# Patient Record
Sex: Female | Born: 1995
Health system: Southern US, Community
[De-identification: ages and names within clinical notes are randomized; demographics above are authoritative.]

## PROBLEM LIST (undated history)

## (undated) ENCOUNTER — Inpatient Hospital Stay (HOSPITAL_COMMUNITY): Payer: Private Health Insurance - Indemnity

## (undated) DIAGNOSIS — F988 Other specified behavioral and emotional disorders with onset usually occurring in childhood and adolescence: Secondary | ICD-10-CM

## (undated) DIAGNOSIS — I10 Essential (primary) hypertension: Secondary | ICD-10-CM

## (undated) DIAGNOSIS — E669 Obesity, unspecified: Secondary | ICD-10-CM

## (undated) DIAGNOSIS — F419 Anxiety disorder, unspecified: Secondary | ICD-10-CM

## (undated) HISTORY — PX: BARIATRIC SURGERY: SHX1103

## (undated) HISTORY — DX: Obesity, unspecified: E66.9

## (undated) HISTORY — PX: CHOLECYSTECTOMY: SHX55

---
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
## 2017-06-20 ENCOUNTER — Encounter (HOSPITAL_BASED_OUTPATIENT_CLINIC_OR_DEPARTMENT_OTHER): Payer: Self-pay

## 2017-06-20 ENCOUNTER — Other Ambulatory Visit: Payer: Self-pay

## 2017-06-20 ENCOUNTER — Emergency Department (HOSPITAL_BASED_OUTPATIENT_CLINIC_OR_DEPARTMENT_OTHER): Payer: Medicaid Other

## 2017-06-20 ENCOUNTER — Emergency Department (HOSPITAL_BASED_OUTPATIENT_CLINIC_OR_DEPARTMENT_OTHER)
Admission: EM | Admit: 2017-06-20 | Discharge: 2017-06-20 | Disposition: A | Discharge status: Home / Self Care | Payer: Medicaid Other | Attending: Emergency Medicine | Admitting: Emergency Medicine

## 2017-06-20 DIAGNOSIS — R072 Precordial pain: Secondary | ICD-10-CM | POA: Diagnosis not present

## 2017-06-20 DIAGNOSIS — R0602 Shortness of breath: Secondary | ICD-10-CM | POA: Diagnosis not present

## 2017-06-20 DIAGNOSIS — N3000 Acute cystitis without hematuria: Secondary | ICD-10-CM | POA: Diagnosis not present

## 2017-06-20 DIAGNOSIS — R079 Chest pain, unspecified: Secondary | ICD-10-CM | POA: Diagnosis present

## 2017-06-20 DIAGNOSIS — Z79899 Other long term (current) drug therapy: Secondary | ICD-10-CM | POA: Insufficient documentation

## 2017-06-20 DIAGNOSIS — R002 Palpitations: Secondary | ICD-10-CM | POA: Insufficient documentation

## 2017-06-20 DIAGNOSIS — I10 Essential (primary) hypertension: Secondary | ICD-10-CM | POA: Diagnosis not present

## 2017-06-20 HISTORY — DX: Other specified behavioral and emotional disorders with onset usually occurring in childhood and adolescence: F98.8

## 2017-06-20 HISTORY — DX: Anxiety disorder, unspecified: F41.9

## 2017-06-20 HISTORY — DX: Essential (primary) hypertension: I10

## 2017-06-20 LAB — COMPREHENSIVE METABOLIC PANEL
ALK PHOS: 96 U/L (ref 38–126)
ALT: 23 U/L (ref 14–54)
ANION GAP: 5 (ref 5–15)
AST: 19 U/L (ref 15–41)
Albumin: 4.1 g/dL (ref 3.5–5.0)
BILIRUBIN TOTAL: 0.2 mg/dL — AB (ref 0.3–1.2)
BUN: 10 mg/dL (ref 6–20)
CALCIUM: 8.9 mg/dL (ref 8.9–10.3)
CO2: 26 mmol/L (ref 22–32)
Chloride: 105 mmol/L (ref 101–111)
Creatinine, Ser: 0.61 mg/dL (ref 0.44–1.00)
GLUCOSE: 88 mg/dL (ref 65–99)
Potassium: 3.6 mmol/L (ref 3.5–5.1)
Sodium: 136 mmol/L (ref 135–145)
Total Protein: 8.1 g/dL (ref 6.5–8.1)

## 2017-06-20 LAB — TROPONIN I: Troponin I: <0.03 ng/mL (ref <0.03)

## 2017-06-20 LAB — CBC WITH DIFFERENTIAL/PLATELET
Basophils Absolute: 0 10*3/uL (ref 0.0–0.1)
Basophils Relative: 0 %
Eosinophils Absolute: 0.2 10*3/uL (ref 0.0–0.7)
Eosinophils Relative: 2 %
HCT: 42.1 % (ref 36.0–46.0)
HEMOGLOBIN: 13.7 g/dL (ref 12.0–15.0)
LYMPHS ABS: 1.6 10*3/uL (ref 0.7–4.0)
LYMPHS PCT: 14 %
MCH: 27.1 pg (ref 26.0–34.0)
MCHC: 32.5 g/dL (ref 30.0–36.0)
MCV: 83.4 fL (ref 78.0–100.0)
MONOS PCT: 6 %
Monocytes Absolute: 0.7 10*3/uL (ref 0.1–1.0)
NEUTROS PCT: 78 %
Neutro Abs: 9.2 10*3/uL — ABNORMAL HIGH (ref 1.7–7.7)
Platelets: 233 10*3/uL (ref 150–400)
RBC: 5.05 MIL/uL (ref 3.87–5.11)
RDW: 15.6 % — ABNORMAL HIGH (ref 11.5–15.5)
WBC: 11.7 10*3/uL — AB (ref 4.0–10.5)

## 2017-06-20 LAB — URINALYSIS, ROUTINE W REFLEX MICROSCOPIC
Bilirubin Urine: NEGATIVE
Glucose, UA: NEGATIVE mg/dL
HGB URINE DIPSTICK: NEGATIVE
Ketones, ur: NEGATIVE mg/dL
Nitrite: POSITIVE — AB
Protein, ur: NEGATIVE mg/dL
pH: 6.5 (ref 5.0–8.0)

## 2017-06-20 LAB — URINALYSIS, MICROSCOPIC (REFLEX)

## 2017-06-20 LAB — D-DIMER, QUANTITATIVE (NOT AT ARMC): D DIMER QUANT: 0.39 ug{FEU}/mL (ref 0.00–0.50)

## 2017-06-20 LAB — MAGNESIUM: MAGNESIUM: 1.9 mg/dL (ref 1.7–2.4)

## 2017-06-20 LAB — PREGNANCY, URINE: Preg Test, Ur: NEGATIVE

## 2017-06-20 LAB — LIPASE, BLOOD: Lipase: 22 U/L (ref 11–51)

## 2017-06-20 MED ORDER — TRAMADOL HCL 50 MG PO TABS
50.0000 mg | ORAL_TABLET | Freq: Once | ORAL | Status: AC
  Administered 2017-06-20: 50 mg via ORAL
  Filled 2017-06-20: qty 1

## 2017-06-20 MED ORDER — TRAMADOL HCL 50 MG PO TABS: 50.0000 mg | ORAL_TABLET | Freq: Four times a day (QID) | ORAL | 0 refills | Status: AC | PRN

## 2017-06-20 MED ORDER — CEPHALEXIN 500 MG PO CAPS: 500.0000 mg | ORAL_CAPSULE | Freq: Two times a day (BID) | ORAL | 0 refills | Status: AC

## 2017-06-20 MED ORDER — ASPIRIN 81 MG PO CHEW
324.0000 mg | CHEWABLE_TABLET | Freq: Once | ORAL | Status: AC
  Administered 2017-06-20: 324 mg via ORAL
  Filled 2017-06-20: qty 4

## 2017-06-20 MED ORDER — CYCLOBENZAPRINE HCL 10 MG PO TABS: 10.0000 mg | ORAL_TABLET | Freq: Two times a day (BID) | ORAL | 0 refills | Status: AC | PRN

## 2017-06-20 NOTE — ED Notes (Signed)
ED Provider at bedside. 

## 2017-06-20 NOTE — ED Notes (Signed)
Patient transported to X-ray 

## 2017-06-20 NOTE — ED Provider Notes (Signed)
[facility] EMERGENCY DEPARTMENT Provider Note   CSN: [id number] Arrival date & time: 06/20/17  1129     History   Chief Complaint Chief Complaint  Patient presents with  . Chest Pain    HPI Emma Cherry is a 21 y.o. female.  The history is provided by the patient, a parent and medical records. No language interpreter was used.  Chest Pain   This is a new problem. The current episode started 12 to 24 hours ago. The problem occurs constantly. The problem has not changed since onset.The pain is present in the substernal region. The pain is moderate. The quality of the pain is described as pleuritic and sharp. The pain does not radiate. The symptoms are aggravated by certain positions and deep breathing. Associated symptoms include nausea, palpitations and shortness of breath. Pertinent negatives include no abdominal pain, no back pain, no cough, no diaphoresis, no dizziness, no fever, no headaches, no hemoptysis, no leg pain, no lower extremity edema, no near-syncope, no numbness and no sputum production. She has tried nothing for the symptoms. The treatment provided no relief.    Past Medical History:  Diagnosis Date  . ADD (attention deficit disorder)   . Anxiety   . Hypertension   . Obesity     Patient Active Problem List   Diagnosis Date Noted  . Depressive disorder, not elsewhere classified 02/27/2013  . Attention deficit disorder without mention of hyperactivity 02/22/2013    Past Surgical History:  Procedure Laterality Date  . CHOLECYSTECTOMY    . VAGINAL DELIVERY     X 2  . WISDOM TOOTH EXTRACTION  May 2014    OB History    No data available       Home Medications    Prior to Admission medications   Medication Sig Start Date End Date Taking? Authorizing Provider  ALPRAZolam Prudy Feeler(XANAX) 0.5 MG tablet Take 0.5 mg at bedtime as needed by mouth for anxiety.   Yes [provider]  lisdexamfetamine (VYVANSE) 70 MG capsule Take 70 mg daily by  mouth.   Yes [provider]  methylphenidate (CONCERTA) 36 MG CR tablet Take 1 tablet (36 mg total) by mouth 2 (two) times daily. 03/01/13  Yes Yolande JollyWatt, Alan H, PA-C  sertraline (ZOLOFT) 25 MG tablet Take 25 mg daily by mouth.   Yes [provider]    Family History Family History  Problem Relation Age of Onset  . Bipolar disorder Father   . Bipolar disorder Brother   . Alcohol abuse Maternal Grandfather   . Drug abuse Cousin     Social History Social History   Tobacco Use  . Smoking status: Never Smoker  . Smokeless tobacco: Never Used  Substance Use Topics  . Alcohol use: No  . Drug use: No     Allergies   Codeine   Review of Systems Review of Systems  Constitutional: Negative for chills, diaphoresis, fatigue and fever.  HENT: Negative for congestion.   Eyes: Negative for visual disturbance.  Respiratory: Positive for shortness of breath. Negative for cough, hemoptysis, sputum production, chest tightness, wheezing and stridor.   Cardiovascular: Positive for chest pain and palpitations. Negative for leg swelling and near-syncope.  Gastrointestinal: Positive for nausea. Negative for abdominal pain and diarrhea.  Genitourinary: Negative for dysuria, enuresis and pelvic pain.  Musculoskeletal: Negative for back pain, neck pain and neck stiffness.  Skin: Negative for wound.  Neurological: Negative for dizziness, numbness and headaches.  All other systems reviewed and are  negative.    Physical Exam Updated Vital Signs BP 113/72 (BP Location: Right Arm)   Pulse (!) 105   Temp 98.2 F (36.8 C) (Oral)   Resp 12   Ht 5\' 8"  (1.727 m)   Wt 129.3 kg (285 lb)   SpO2 100%   BMI 43.33 kg/m   Physical Exam  Constitutional: She is oriented to person, place, and time. She appears well-developed and well-nourished.  Non-toxic appearance. She does not appear ill. No distress.  HENT:  Head: Normocephalic and atraumatic.  Right Ear: External ear normal.  Left  Ear: External ear normal.  Nose: Nose normal.  Mouth/Throat: Oropharynx is clear and moist. No oropharyngeal exudate.  Eyes: Conjunctivae and EOM are normal. Pupils are equal, round, and reactive to light.  Neck: Normal range of motion. Neck supple.  Cardiovascular: Regular rhythm. Tachycardia present.    No systolic murmur is present. Pulmonary/Chest: No stridor. No respiratory distress. She has no wheezes. She has no rhonchi. She has no rales. She exhibits bony tenderness.    Abdominal: She exhibits no distension. There is no tenderness. There is no rebound.  Musculoskeletal:       Right lower leg: She exhibits no tenderness.       Left lower leg: She exhibits no tenderness.  Lymphadenopathy:    She has no cervical adenopathy.  Neurological: She is alert and oriented to person, place, and time. She has normal reflexes. She exhibits normal muscle tone. Coordination normal.  Skin: Skin is warm. No rash noted. She is not diaphoretic. No erythema.  Nursing note and vitals reviewed.    ED Treatments / Results  Labs (all labs ordered are listed, but only abnormal results are displayed) Labs Reviewed  CBC WITH DIFFERENTIAL/PLATELET - Abnormal; Notable for the following components:      Result Value   WBC 11.7 (*)    RDW 15.6 (*)    Neutro Abs 9.2 (*)    All other components within normal limits  COMPREHENSIVE METABOLIC PANEL - Abnormal; Notable for the following components:   Total Bilirubin 0.2 (*)    All other components within normal limits  URINALYSIS, ROUTINE W REFLEX MICROSCOPIC - Abnormal; Notable for the following components:   APPearance CLOUDY (*)    Specific Gravity, Urine >1.030 (*)    Nitrite POSITIVE (*)    Leukocytes, UA SMALL (*)    All other components within normal limits  URINALYSIS, MICROSCOPIC (REFLEX) - Abnormal; Notable for the following components:   Bacteria, UA MANY (*)    Squamous Epithelial / LPF 6-30 (*)    All other components within normal limits   LIPASE, BLOOD  PREGNANCY, URINE  TROPONIN I  TROPONIN I  D-DIMER, QUANTITATIVE (NOT AT [facility])  MAGNESIUM    EKG  EKG Interpretation  Date/Time:  Monday June 20 2017 11:36:48 EST Ventricular Rate:  109 PR Interval:    QRS Duration: 83 QT Interval:  336 QTC Calculation: 453 R Axis:   63 Text Interpretation:  Sinus tachycardia Borderline Q waves in inferior leads Baseline wander in lead(s) II No prior ECG for comparison.  S1Q3T3 seen.  No STEMI Confirmed by Theda Belfastegeler, Chris ([id number]) on 06/20/2017 11:44:42 AM       Radiology Dg Chest 2 View  Result Date: 06/20/2017 CLINICAL DATA:  Chest pain and nausea. EXAM: CHEST  2 VIEW COMPARISON:  None. FINDINGS: Trachea is midline. Heart size normal. Lungs are low in volume but clear. No pleural fluid. IMPRESSION: Low lung volumes.  No  acute findings. Electronically Signed   By: Leanna Battles M.D.   On: 06/20/2017 12:27    Procedures Procedures (including critical care time)  Medications Ordered in ED Medications  aspirin chewable tablet 324 mg (324 mg Oral Given 06/20/17 1222)  traMADol (ULTRAM) tablet 50 mg (50 mg Oral Given 06/20/17 1222)     Initial Impression / Assessment and Plan / ED Course  I have reviewed the triage vital signs and the nursing notes.  Pertinent labs & imaging results that were available during my care of the patient were reviewed by me and considered in my medical decision making (see chart for details).      Myka Hitz is a 21 y.o. female with a past medical history significant for anxiety and hypertension who presents with left-sided chest pain.  Patient reports that she was feeling normal yesterday but woke up today with discomfort in her chest.  She describes as a tightness and sharpness.  It is in the left chest radiating up to the left upper chest.  She reports associated shortness of breath and nausea but no vomiting.  She reports some mild diaphoresis but no syncopal episodes.  She does report  feeling irregular heartbeats at times.  Patient is anxious and very concerned because she has a strong family history of cardiac disease.  She denies any leg pain or leg swelling.  No history of DVT or PE.  Patient has a family history of heart failure and atrial fibrillation.  Patient denies any recent trauma or any changes in medications.  She denies any recent fevers, chills, or cough.  She denies any hemoptysis.  She does report the pain is pleuritic in nature it is not exertional.  She describes her pain as moderate to severe as a 9 out of 10 in severity.  On exam, chest is tender to palpation.  Discomfort is reproduced with palpation.  Lungs are clear.  No murmur appreciated.  Abdomen nontender.  Legs were nonedematous and nontender.  No CVA tenderness.  No focal neurologic deficits.  Patient had pulses in all extremities.  EKG showed possible S1/Q3T3.  Given the pleuritic discomfort, d-dimer was ordered.  No STEMI was seen.  Patient was given aspirin and troponins were.  Patient will have laboratory testing and x-ray to further evaluate.  I suspect a muscular skeletal pain, patient will be given tramadol as she reports other pain medications of caused hyperactivity.  Anticipate reassessment after workup.  Patient's heart score is a 3.  Laboratory testing revealed negative troponin x2.  Negative pregnancy test.  Negative d-dimer.  The patient was found to have evidence of urinary tract infection with nitrites leukocytes and bacteria.  Patient will be treated for UTI.  Chest x-ray reassuring.  EKG showed no STEMI.  Given the patient's chest tenderness, suspect a musculoskeletal discomfort.  Patient given prescription for pain medicine, muscle relaxant, and antibiotics.  Patient reported feeling better after her pain medication in the ED.    Patient will follow up with her PCP and understood return precautions.  Patient had no other questions or concerns and was discharged in good condition with  improving symptoms.    Final Clinical Impressions(s) / ED Diagnoses   Final diagnoses:  Precordial pain  Acute cystitis without hematuria    ED Discharge Orders        Ordered    traMADol (ULTRAM) 50 MG tablet  Every 6 hours PRN     06/20/17 1705    cyclobenzaprine (FLEXERIL) 10  MG tablet  2 times daily PRN     06/20/17 1705    cephALEXin (KEFLEX) 500 MG capsule  2 times daily     06/20/17 1705      Clinical Impression: 1. Precordial pain   2. Acute cystitis without hematuria     Disposition: Discharge  Condition: Good  I have discussed the results, Dx and Tx plan with the pt(& family if present). He/she/they expressed understanding and agree(s) with the plan. Discharge instructions discussed at great length. Strict return precautions discussed and pt &/or family have verbalized understanding of the instructions. No further questions at time of discharge.    This SmartLink is deprecated. Use AVSMEDLIST instead to display the medication list for a patient.  Follow Up: Leola Brazil, DO [address] [place] [address] [phone]  Schedule an appointment as soon as possible for a visit    [facility] [address] [id number] mc [address] [place] [address] [phone]  If symptoms worsen     Anelise Staron, Canary Brim, MD 06/20/17 2009

## 2017-06-20 NOTE — Discharge Instructions (Signed)
Your workup today showed no evidence of a heart or lung cause of your discomfort.  As you were very tender on the outside, we suspect it is a musculoskeletal type chest pain.  We also found evidence of urinary tract infection today.  Please take the antibiotics to treat the UTI and take the pain and muscle relaxant to help the chest discomfort.  Please stay hydrated and rest.  Please follow-up with your primary care physician.  If any symptoms change or worsen, please return to the nearest emergency department.

## 2017-06-20 NOTE — ED Triage Notes (Addendum)
Pt reports about 3 hours ago while at work she started having upper mid chest pain with some nausea. Pt has family history of heart issues. Pt has history of anxiety. Pt reports she has never had this kind of pain before.

## 2018-11-29 ENCOUNTER — Emergency Department (HOSPITAL_BASED_OUTPATIENT_CLINIC_OR_DEPARTMENT_OTHER): Payer: Medicaid Other

## 2018-11-29 ENCOUNTER — Telehealth: Payer: Medicaid Other | Admitting: Family

## 2018-11-29 ENCOUNTER — Other Ambulatory Visit: Payer: Self-pay

## 2018-11-29 ENCOUNTER — Emergency Department (HOSPITAL_BASED_OUTPATIENT_CLINIC_OR_DEPARTMENT_OTHER)
Admission: EM | Admit: 2018-11-29 | Discharge: 2018-11-29 | Disposition: A | Discharge status: Home / Self Care | Payer: Medicaid Other | Attending: Emergency Medicine | Admitting: Emergency Medicine

## 2018-11-29 ENCOUNTER — Encounter (HOSPITAL_BASED_OUTPATIENT_CLINIC_OR_DEPARTMENT_OTHER): Payer: Self-pay | Admitting: Emergency Medicine

## 2018-11-29 DIAGNOSIS — R0602 Shortness of breath: Secondary | ICD-10-CM | POA: Insufficient documentation

## 2018-11-29 DIAGNOSIS — R509 Fever, unspecified: Secondary | ICD-10-CM | POA: Diagnosis not present

## 2018-11-29 DIAGNOSIS — I10 Essential (primary) hypertension: Secondary | ICD-10-CM | POA: Diagnosis not present

## 2018-11-29 DIAGNOSIS — Z79899 Other long term (current) drug therapy: Secondary | ICD-10-CM | POA: Insufficient documentation

## 2018-11-29 DIAGNOSIS — R0600 Dyspnea, unspecified: Secondary | ICD-10-CM

## 2018-11-29 LAB — URINALYSIS, MICROSCOPIC (REFLEX)

## 2018-11-29 LAB — URINALYSIS, ROUTINE W REFLEX MICROSCOPIC
Bilirubin Urine: NEGATIVE
Glucose, UA: NEGATIVE mg/dL
Ketones, ur: NEGATIVE mg/dL
Nitrite: NEGATIVE
Protein, ur: NEGATIVE mg/dL
Specific Gravity, Urine: >1.03 — ABNORMAL HIGH (ref 1.005–1.030)
pH: 6 (ref 5.0–8.0)

## 2018-11-29 MED ORDER — ONDANSETRON 4 MG PO TBDP: 4.0000 mg | ORAL_TABLET | Freq: Three times a day (TID) | ORAL | 0 refills | Status: AC | PRN

## 2018-11-29 MED ORDER — SODIUM CHLORIDE 0.9 % IV BOLUS
1000.0000 mL | Freq: Once | INTRAVENOUS | Status: AC
Start: 2018-11-29 — End: 2018-11-29
  Administered 2018-11-29: 16:00:00 1000 mL via INTRAVENOUS

## 2018-11-29 MED ORDER — KETOROLAC TROMETHAMINE 30 MG/ML IJ SOLN
30.0000 mg | Freq: Once | INTRAMUSCULAR | Status: AC
  Administered 2018-11-29: 30 mg via INTRAVENOUS
  Filled 2018-11-29: qty 1

## 2018-11-29 MED ORDER — ONDANSETRON HCL 4 MG/2ML IJ SOLN
4.0000 mg | Freq: Once | INTRAMUSCULAR | Status: AC
  Administered 2018-11-29: 4 mg via INTRAVENOUS
  Filled 2018-11-29: qty 2

## 2018-11-29 MED ORDER — IBUPROFEN 600 MG PO TABS: 600.0000 mg | ORAL_TABLET | Freq: Three times a day (TID) | ORAL | 0 refills | Status: AC | PRN

## 2018-11-29 MED FILL — ONDANSETRON ODT 4 MG TABLET: 4 | 6 days supply | Qty: 20 | Fill #0

## 2018-11-29 MED FILL — IBUPROFEN 600 MG TABLET: 600 | 7 days supply | Qty: 21 | Fill #0

## 2018-11-29 NOTE — Progress Notes (Signed)
  E-Visit for [employer] Virus Screening  Based on what you have shared with me, you need to seek an evaluation for a severe illness that is causing your symptoms which may be coronavirus or some other illness. I recommend that you be seen and evaluated "face to face". Our Emergency Departments are best equipped to handle patients with severe symptoms.   I recommend the following:  . If you are having a true medical emergency please call 911. . If you are considered high risk for Corona virus because of a known exposure, fever, shortness of breath and cough, OR if you have severe symptoms of any kind, seek medical care at an emergency room.  . Please call ahead and tell them that you were seen by telemedicine and they have recommended that you have a face to face evaluation. [facility] Emergency Department [address] [place], [place], [place] [address] [phone]  . [facility] Emergency Department [address] [place], [place] [address] [phone]  . [facility] Emergency Department [address] [place], [place], [place] [address] [phone]  . [facility] Emergency Department [address] [place], [place], [place] [address] [phone]  . [facility] Emergency Department [address] [place], [place], [place] [address] [phone]  NOTE: If you entered your credit card information for this eVisit, you will not be charged. You may see a "hold" on your card for the $35 but that hold will drop off and you will not have a charge processed.   Your e-visit answers were reviewed by a board certified advanced clinical practitioner to complete your personal care plan.  Thank you for using e-Visits. Greater than 5 minutes, yet less than 10 minutes of time have been spent researching, coordinating, and implementing care for this patient today.  Thank you for the details you included in the  comment boxes. Those details are very helpful in determining the best course of treatment for you and help [place] to provide the best care.

## 2018-11-29 NOTE — ED Provider Notes (Signed)
[facility] EMERGENCY DEPARTMENT Provider Note   CSN: [id number] Arrival date & time: 11/29/18  1400    History   Chief Complaint Chief Complaint  Patient presents with   Shortness of Breath    HPI Emma Cherry is a 23 y.o. female.     The history is provided by the patient and medical records. No language interpreter was used.  Shortness of Breath  Associated symptoms: fever   Associated symptoms: no abdominal pain, no cough, no sore throat and no vomiting    Emma Cherry is a 23 y.o. female  with a PMH as listed below who presents to the Emergency Department complaining of shortness of breath and fever of 101 which began yesterday.  Did not take her temperature this morning, but did feel feverish again.  She informed nursing staff that she took an ibuprofen this morning.  She told me that she has not taken any medications other than her usual home medications today.  She is afebrile in triage.  She states that she works at [employer] and 1 of her coworkers was diagnosed with coronavirus.  She was in contact with him at work 2 or 3 days before she found out about his diagnosis.  She does note associated decreased appetite, myalgias mostly low back pain and fatigue.  She denies any abdominal pain, vomiting, diarrhea, constipation or urinary symptoms.    Past Medical History:  Diagnosis Date   ADD (attention deficit disorder)    Anxiety    Hypertension    Obesity     Patient Active Problem List   Diagnosis Date Noted   Depressive disorder, not elsewhere classified 02/27/2013   Attention deficit disorder without mention of hyperactivity 02/22/2013    Past Surgical History:  Procedure Laterality Date   CHOLECYSTECTOMY     VAGINAL DELIVERY     X 2   WISDOM TOOTH EXTRACTION  May 2014     OB History   No obstetric history on file.      Home Medications    Prior to Admission medications   Medication Sig Start Date End  Date Taking? Authorizing Provider  ALPRAZolam Prudy Feeler) 0.5 MG tablet Take 0.5 mg at bedtime as needed by mouth for anxiety.   Yes [provider]  lisdexamfetamine (VYVANSE) 70 MG capsule Take 70 mg daily by mouth.   Yes [provider]  methylphenidate (CONCERTA) 36 MG CR tablet Take 1 tablet (36 mg total) by mouth 2 (two) times daily. 03/01/13  Yes Yolande Jolly, PA-C  sertraline (ZOLOFT) 25 MG tablet Take 25 mg daily by mouth.   Yes [provider]  cyclobenzaprine (FLEXERIL) 10 MG tablet Take 1 tablet (10 mg total) 2 (two) times daily as needed by mouth for muscle spasms. 06/20/17   Tegeler, Canary Brim, MD  ibuprofen (ADVIL) 600 MG tablet Take 1 tablet (600 mg total) by mouth every 8 (eight) hours as needed for fever, mild pain or moderate pain. 11/29/18   Emma Cherry, Chase Picket, PA-C  ondansetron (ZOFRAN ODT) 4 MG disintegrating tablet Take 1 tablet (4 mg total) by mouth every 8 (eight) hours as needed for nausea or vomiting. 11/29/18   Kaylena Pacifico, Chase Picket, PA-C  traMADol (ULTRAM) 50 MG tablet Take 1 tablet (50 mg total) every 6 (six) hours as needed by mouth. 06/20/17   Tegeler, Canary Brim, MD    Family History Family History  Problem Relation Age of Onset   Bipolar disorder Father  Bipolar disorder Brother    Alcohol abuse Maternal Grandfather    Drug abuse Cousin     Social History Social History   Tobacco Use   Smoking status: Never Smoker   Smokeless tobacco: Never Used  Substance Use Topics   Alcohol use: No   Drug use: No     Allergies   Codeine   Review of Systems Review of Systems  Constitutional: Positive for appetite change and fever.  HENT: Negative for sore throat.   Respiratory: Positive for shortness of breath. Negative for cough.   Gastrointestinal: Positive for nausea. Negative for abdominal pain, constipation, diarrhea and vomiting.  Musculoskeletal: Positive for back pain and myalgias.  All other systems reviewed  and are negative.    Physical Exam Updated Vital Signs BP (!) 123/98 (BP Location: Left Arm)    Pulse 88    Temp 98.4 F (36.9 C) (Oral)    Resp 18    Ht 5\' 8"  (1.727 m)    Wt (!) 145.2 kg    LMP 11/21/2018    SpO2 99%    BMI 48.66 kg/m   Physical Exam Vitals signs and nursing note reviewed.  Constitutional:      General: She is not in acute distress.    Appearance: She is well-developed.  HENT:     Head: Normocephalic and atraumatic.  Neck:     Musculoskeletal: Normal range of motion and neck supple. No neck rigidity.  Cardiovascular:     Rate and Rhythm: Normal rate and regular rhythm.     Heart sounds: Normal heart sounds. No murmur.  Pulmonary:     Effort: Pulmonary effort is normal. No respiratory distress.     Breath sounds: Normal breath sounds.     Comments: Lungs clear to auscultation bilaterally. Abdominal:     General: There is no distension.     Palpations: Abdomen is soft.     Comments: No abdominal tenderness.  Musculoskeletal:     Comments: No midline C/T/L spine tenderness.   Skin:    General: Skin is warm and dry.  Neurological:     Mental Status: She is alert and oriented to person, place, and time.      ED Treatments / Results  Labs (all labs ordered are listed, but only abnormal results are displayed) Labs Reviewed  URINALYSIS, ROUTINE W REFLEX MICROSCOPIC - Abnormal; Notable for the following components:      Result Value   Specific Gravity, Urine >1.030 (*)    Hgb urine dipstick MODERATE (*)    Leukocytes,Ua TRACE (*)    All other components within normal limits  URINALYSIS, MICROSCOPIC (REFLEX) - Abnormal; Notable for the following components:   Bacteria, UA MANY (*)    All other components within normal limits  URINE CULTURE    EKG None  Radiology Dg Chest Portable 1 View  Result Date: 11/29/2018 CLINICAL DATA:  Shortness of breath and fever EXAM: PORTABLE CHEST 1 VIEW COMPARISON:  Chest radiograph June 20, 2017 and chest CT  August 04, 2018 FINDINGS: There is no edema or consolidation. Heart size and pulmonary vascularity are within normal limits. No adenopathy. No evident bone lesions. IMPRESSION: No edema or consolidation. Electronically Signed   By: Bretta BangWilliam  Woodruff III M.D.   On: 11/29/2018 15:10    Procedures Procedures (including critical care time)  Medications Ordered in ED Medications  sodium chloride 0.9 % bolus 1,000 mL (1,000 mLs Intravenous New Bag/Given 11/29/18 1549)  ondansetron (ZOFRAN) injection 4 mg (4  mg Intravenous Given 11/29/18 1546)  ketorolac (TORADOL) 30 MG/ML injection 30 mg (30 mg Intravenous Given 11/29/18 1546)     Initial Impression / Assessment and Plan / ED Course  I have reviewed the triage vital signs and the nursing notes.  Pertinent labs & imaging results that were available during my care of the patient were reviewed by me and considered in my medical decision making (see chart for details).       Emma Cherry is a 23 y.o. female who presents to ED for fever (Tmax 101 at home yesterday), shortness of breath, fatigue, myalgias and decreased appetite. She is afebrile in ED today. Hemodynamically stable, oxygenating well. Lungs clear. CXR independently reviewed by me. No acute abnormalities - no PNA. Does report myalgias, mostly low back pain. No midline tenderness. NVI extremities. No abdominal tenderness. Given back pain, UA was obtained showing negative nitrite, trace leuks with 6-10 wbcs and many bacteria. 11-20 squamous noted. Sent for cx. Given no dysuria, urgency or frequency + possible contaminant, will hold on treatment and await culture data. Patient does have contact with covid + patient. Discussed that covid is in the differential today for her. Discussed importance of self-quarantine. Also discussed home care instructions and return precautions. All questions answered.     Emma Cherry was evaluated in Emergency Department on 11/29/2018 for the symptoms  described in the history of present illness. She was evaluated in the context of the global COVID-19 pandemic, which necessitated consideration that the patient might be at risk for infection with the SARS-CoV-2 virus that causes COVID-19. Institutional protocols and algorithms that pertain to the evaluation of patients at risk for COVID-19 are in a state of rapid change based on information released by regulatory bodies including the CDC and federal and state organizations. These policies and algorithms were followed during the patient's care in the ED.   Final Clinical Impressions(s) / ED Diagnoses   Final diagnoses:  Shortness of breath  Febrile illness    ED Discharge Orders         Ordered    ibuprofen (ADVIL) 600 MG tablet  Every 8 hours PRN     11/29/18 1643    ondansetron (ZOFRAN ODT) 4 MG disintegrating tablet  Every 8 hours PRN     11/29/18 1643           Zaccai Chavarin, Chase Picket, PA-C 11/29/18 1652    Alvira Monday, MD 11/29/18 2235

## 2018-11-29 NOTE — ED Triage Notes (Signed)
Pt reports SOB and fever since yesterday. She states a coworker is confirmed to have COVID. She took ibuprofen this morning.

## 2018-11-29 NOTE — Discharge Instructions (Signed)
Zofran as needed for nausea.  Alternate between Tylenol and Ibuprofen as needed for pain.  As we discussed, it is possible that you have coronavirus. You should self-quarantine until you have no symptoms and have had no fever for at least 73 hours. Follow additional instructions provided.  Call your primary care doctor tomorrow morning to schedule a follow up call or visit.  Return to ER for new or worsening symptoms, any additional concerns.   Stay home except to get medical care You should restrict activities outside your home, except for getting medical care. Do not go to work, school, or public areas, and do not use public transportation or taxis.  Call ahead before visiting your doctor Before your medical appointment, call the healthcare provider and tell them that you have, or are being evaluated for, COVID-19 infection. This will help the healthcare providers office take steps to keep other people from getting infected. Ask your healthcare provider to call the local or state health department.  Monitor your symptoms Seek prompt medical attention if your illness is worsening (e.g., difficulty breathing). Before going to your medical appointment, call the healthcare provider and tell them that you have, or are being evaluated for, COVID-19 infection. Ask your healthcare provider to call the local or state health department.  Wear a facemask You should wear a facemask that covers your nose and mouth when you are in the same room with other people and when you visit a healthcare provider. People who live with or visit you should also wear a facemask while they are in the same room with you.  Separate yourself from other people in your home As much as possible, you should stay in a different room from other people in your home. Also, you should use a separate bathroom, if available.  Avoid sharing household items You should not share dishes, drinking glasses, cups, eating utensils,  towels, bedding, or other items with other people in your home. After using these items, you should wash them thoroughly with soap and water.  Cover your coughs and sneezes Cover your mouth and nose with a tissue when you cough or sneeze, or you can cough or sneeze into your sleeve. Throw used tissues in a lined trash can, and immediately wash your hands with soap and water for at least 20 seconds or use an alcohol-based hand rub.  Wash your Union Pacific Corporationhands Wash your hands often and thoroughly with soap and water for at least 20 seconds. You can use an alcohol-based hand sanitizer if soap and water are not available and if your hands are not visibly dirty. Avoid touching your eyes, nose, and mouth with unwashed hands.   Prevention Steps for Caregivers and Household Members of Individuals Confirmed to have, or Being Evaluated for, COVID-19 Infection Being Cared for in the Home  If you live with, or provide care at home for, a person confirmed to have, or being evaluated for, COVID-19 infection please follow these guidelines to prevent infection:  Follow healthcare providers instructions Make sure that you understand and can help the patient follow any healthcare provider instructions for all care.  Provide for the patients basic needs You should help the patient with basic needs in the home and provide support for getting groceries, prescriptions, and other personal needs.  Monitor the patients symptoms If they are getting sicker, call his or her medical provider and tell them that the patient has, or is being evaluated for, COVID-19 infection. This will help the healthcare providers office  take steps to keep other people from getting infected. Ask the healthcare provider to call the local or state health department.  Limit the number of people who have contact with the patient If possible, have only one caregiver for the patient. Other household members should stay in another home or  place of residence. If this is not possible, they should stay in another room, or be separated from the patient as much as possible. Use a separate bathroom, if available. Restrict visitors who do not have an essential need to be in the home.  Keep older adults, very young children, and other sick people away from the patient Keep older adults, very young children, and those who have compromised immune systems or chronic health conditions away from the patient. This includes people with chronic heart, lung, or kidney conditions, diabetes, and cancer.  Ensure good ventilation Make sure that shared spaces in the home have good air flow, such as from an air conditioner or an opened window, weather permitting.  Wash your hands often Wash your hands often and thoroughly with soap and water for at least 20 seconds. You can use an alcohol based hand sanitizer if soap and water are not available and if your hands are not visibly dirty. Avoid touching your eyes, nose, and mouth with unwashed hands. Use disposable paper towels to dry your hands. If not available, use dedicated cloth towels and replace them when they become wet.  Wear a facemask and gloves Wear a disposable facemask at all times in the room and gloves when you touch or have contact with the patients blood, body fluids, and/or secretions or excretions, such as sweat, saliva, sputum, nasal mucus, vomit, urine, or feces.  Ensure the mask fits over your nose and mouth tightly, and do not touch it during use. Throw out disposable facemasks and gloves after using them. Do not reuse. Wash your hands immediately after removing your facemask and gloves. If your personal clothing becomes contaminated, carefully remove clothing and launder. Wash your hands after handling contaminated clothing. Place all used disposable facemasks, gloves, and other waste in a lined container before disposing them with other household waste. Remove gloves and wash  your hands immediately after handling these items.  Do not share dishes, glasses, or other household items with the patient Avoid sharing household items. You should not share dishes, drinking glasses, cups, eating utensils, towels, bedding, or other items with a patient who is confirmed to have, or being evaluated for, COVID-19 infection. After the person uses these items, you should wash them thoroughly with soap and water.  Wash laundry thoroughly Immediately remove and wash clothes or bedding that have blood, body fluids, and/or secretions or excretions, such as sweat, saliva, sputum, nasal mucus, vomit, urine, or feces, on them. Wear gloves when handling laundry from the patient. Read and follow directions on labels of laundry or clothing items and detergent. In general, wash and dry with the warmest temperatures recommended on the label.  Clean all areas the individual has used often Clean all touchable surfaces, such as counters, tabletops, doorknobs, bathroom fixtures, toilets, phones, keyboards, tablets, and bedside tables, every day. Also, clean any surfaces that may have blood, body fluids, and/or secretions or excretions on them. Wear gloves when cleaning surfaces the patient has come in contact with. Use a diluted bleach solution (e.g., dilute bleach with 1 part bleach and 10 parts water) or a household disinfectant with a label that says EPA-registered for coronaviruses. To make a bleach  solution at home, add 1 tablespoon of bleach to 1 quart (4 cups) of water. For a larger supply, add  cup of bleach to 1 gallon (16 cups) of water. Read labels of cleaning products and follow recommendations provided on product labels. Labels contain instructions for safe and effective use of the cleaning product including precautions you should take when applying the product, such as wearing gloves or eye protection and making sure you have good ventilation during use of the product. Remove gloves and  wash hands immediately after cleaning.  Monitor yourself for signs and symptoms of illness Caregivers and household members are considered close contacts, should monitor their health, and will be asked to limit movement outside of the home to the extent possible. Follow the monitoring steps for close contacts listed on the symptom monitoring form.   ? If you have additional questions, contact your local health department or call the epidemiologist on call at [phone] (available 24/7). ? This guidance is subject to change. For the most up-to-date guidance from [facility], please refer to their website: [email or web address]hu

## 2018-11-30 LAB — URINE CULTURE

## 2019-02-03 ENCOUNTER — Encounter (HOSPITAL_COMMUNITY): Payer: Self-pay

## 2019-02-03 ENCOUNTER — Emergency Department (HOSPITAL_COMMUNITY): Payer: Medicaid Other

## 2019-02-03 ENCOUNTER — Emergency Department (HOSPITAL_COMMUNITY)
Admission: EM | Admit: 2019-02-03 | Discharge: 2019-02-03 | Disposition: A | Discharge status: Home / Self Care | Payer: Medicaid Other | Attending: Emergency Medicine | Admitting: Emergency Medicine

## 2019-02-03 DIAGNOSIS — Y998 Other external cause status: Secondary | ICD-10-CM | POA: Insufficient documentation

## 2019-02-03 DIAGNOSIS — Y93H9 Activity, other involving exterior property and land maintenance, building and construction: Secondary | ICD-10-CM | POA: Insufficient documentation

## 2019-02-03 DIAGNOSIS — S8992XA Unspecified injury of left lower leg, initial encounter: Secondary | ICD-10-CM | POA: Diagnosis present

## 2019-02-03 DIAGNOSIS — Y9289 Other specified places as the place of occurrence of the external cause: Secondary | ICD-10-CM | POA: Diagnosis not present

## 2019-02-03 DIAGNOSIS — S81812A Laceration without foreign body, left lower leg, initial encounter: Secondary | ICD-10-CM

## 2019-02-03 DIAGNOSIS — W293XXA Contact with powered garden and outdoor hand tools and machinery, initial encounter: Secondary | ICD-10-CM | POA: Insufficient documentation

## 2019-02-03 LAB — BASIC METABOLIC PANEL
Anion gap: 11 (ref 5–15)
BUN: 16 mg/dL (ref 6–20)
CO2: 19 mmol/L — ABNORMAL LOW (ref 22–32)
Calcium: 9.1 mg/dL (ref 8.9–10.3)
Chloride: 106 mmol/L (ref 98–111)
Creatinine, Ser: 0.76 mg/dL (ref 0.44–1.00)
GFR calc Af Amer: >60 mL/min (ref >60)
GFR calc non Af Amer: >60 mL/min (ref >60)
Glucose, Bld: 96 mg/dL (ref 70–99)
Potassium: 3.9 mmol/L (ref 3.5–5.1)
Sodium: 136 mmol/L (ref 135–145)

## 2019-02-03 LAB — BPAM FFP
Blood Product Expiration Date: 202007092359
Blood Product Expiration Date: 202007092359
ISSUE DATE / TIME: 202006271424
ISSUE DATE / TIME: 202006271424
Unit Type and Rh: 6200
Unit Type and Rh: 6200

## 2019-02-03 LAB — PREPARE FRESH FROZEN PLASMA
Unit division: 0
Unit division: 0

## 2019-02-03 LAB — CBC
HCT: 40 % (ref 36.0–46.0)
Hemoglobin: 12.8 g/dL (ref 12.0–15.0)
MCH: 27.3 pg (ref 26.0–34.0)
MCHC: 32 g/dL (ref 30.0–36.0)
MCV: 85.3 fL (ref 80.0–100.0)
Platelets: 259 10*3/uL (ref 150–400)
RBC: 4.69 MIL/uL (ref 3.87–5.11)
RDW: 14.4 % (ref 11.5–15.5)
WBC: 14.7 10*3/uL — ABNORMAL HIGH (ref 4.0–10.5)
nRBC: 0 % (ref 0.0–0.2)

## 2019-02-03 MED ORDER — SODIUM CHLORIDE 0.9 % IV BOLUS
1000.0000 mL | Freq: Once | INTRAVENOUS | Status: AC
  Administered 2019-02-03: 1000 mL via INTRAVENOUS

## 2019-02-03 MED ORDER — TETANUS-DIPHTH-ACELL PERTUSSIS 5-2.5-18.5 LF-MCG/0.5 IM SUSP
0.5000 mL | Freq: Once | INTRAMUSCULAR | Status: AC
  Administered 2019-02-03: 0.5 mL via INTRAMUSCULAR
  Filled 2019-02-03: qty 0.5

## 2019-02-03 MED ORDER — CEPHALEXIN 500 MG PO CAPS: 500.0000 mg | ORAL_CAPSULE | Freq: Two times a day (BID) | ORAL | 0 refills | Status: AC

## 2019-02-03 NOTE — ED Notes (Signed)
Patient verbalizes understanding of discharge instructions. Opportunity for questioning and answers were provided. Armband removed by staff, pt discharged from ED.  

## 2019-02-03 NOTE — ED Triage Notes (Signed)
Pt brought in by [facility] for left thigh laceration after cutting a tree with a chainsaw. Per EMS pt had x4 episodes of syncope, c/o left leg pain.

## 2019-02-03 NOTE — ED Provider Notes (Signed)
[facility] Emergency Department Provider Note MRN:  [id number]  Arrival date & time: 02/03/19     Chief Complaint   Leg Injury   History of Present Illness   Emma Cherry is a 23 y.o. year-old female with no prior past medical history presenting to the ED with chief complaint of leg injury.  Shortly prior to arrival, patient was using a chainsaw, while in use it bounced off of the tree and struck her in the upper left leg, causing a deep laceration.  She had reported 3 syncopal episodes prior to arrival.  Bleeding controlled with EMS.  Patient denies any other injuries or pain, pain is currently 8 out of 10, constant, worse with motion or palpation.  Review of Systems  A complete 10 system review of systems was obtained and all systems are negative except as noted in the HPI and PMH.   Patient's Health History   Past medical history: Obesity, scheduled for gastric bypass  Past family history: Noncontributory  Social history: Non-smoker   No family history on file.  Social History   Socioeconomic History  . Marital status: Not on file    Spouse name: Not on file  . Number of children: Not on file  . Years of education: Not on file  . Highest education level: Not on file  Occupational History  . Not on file  Social Needs  . Financial resource strain: Not on file  . Food insecurity    Worry: Not on file    Inability: Not on file  . Transportation needs    Medical: Not on file    Non-medical: Not on file  Tobacco Use  . Smoking status: Not on file  Substance and Sexual Activity  . Alcohol use: Not on file  . Drug use: Not on file  . Sexual activity: Not on file  Lifestyle  . Physical activity    Days per week: Not on file    Minutes per session: Not on file  . Stress: Not on file  Relationships  . Social Herbalist on phone: Not on file    Gets together: Not on file    Attends religious service: Not on file    Active member of  club or organization: Not on file    Attends meetings of clubs or organizations: Not on file    Relationship status: Not on file  . Intimate partner violence    Fear of current or ex partner: Not on file    Emotionally abused: Not on file    Physically abused: Not on file    Forced sexual activity: Not on file  Other Topics Concern  . Not on file  Social History Narrative  . Not on file     Physical Exam  Vital Signs and Nursing Notes reviewed Vitals:   02/03/19 1500 02/03/19 1515  BP: 109/71 101/70  Pulse: 97 (!) 104  Resp: 19 (!) 26  SpO2: 94% 97%    CONSTITUTIONAL: Well-appearing, NAD NEURO:  Alert and oriented x 3, no focal deficits EYES:  eyes equal and reactive ENT/NECK:  no LAD, no JVD CARDIO: Regular rate, well-perfused, normal S1 and S2 PULM:  CTAB no wheezing or rhonchi GI/GU:  normal bowel sounds, non-distended, non-tender MSK/SPINE:  No gross deformities, no edema SKIN: 30 cm linear laceration to the left thigh, medial, midshaft, estimated 5 cm deep, hemostatic PSYCH:  Appropriate speech and behavior  Diagnostic and Interventional Summary  Labs Reviewed  CBC - Abnormal; Notable for the following components:      Result Value   WBC 14.7 (*)    All other components within normal limits  BASIC METABOLIC PANEL - Abnormal; Notable for the following components:   CO2 19 (*)    All other components within normal limits  TYPE AND SCREEN  PREPARE FRESH FROZEN PLASMA    DG Femur Portable 1 View Left  Final Result      Medications  Tdap (BOOSTRIX) injection 0.5 mL (has no administration in time range)     .Marland Kitchen.Laceration Repair  Date/Time: 02/03/2019 4:03 PM Performed by: Sabas SousBero, Chi Garlow M, MD Authorized by: Sabas SousBero, Tia Hieronymus M, MD   Consent:    Consent obtained:  Verbal   Consent given by:  Patient   Risks discussed:  Pain, retained foreign body, poor cosmetic result and infection Anesthesia (see MAR for exact dosages):    Anesthesia method:  Local  infiltration   Local anesthetic:  Lidocaine 2% WITH epi Laceration details:    Location:  Leg   Leg location:  L upper leg   Length (cm):  30   Depth (mm):  4 Repair type:    Repair type:  Complex Pre-procedure details:    Preparation:  Patient was prepped and draped in usual sterile fashion Exploration:    Limited defect created (wound extended): yes     Hemostasis achieved with:  Direct pressure and epinephrine   Wound exploration: wound explored through full range of motion and entire depth of wound probed and visualized     Contaminated: no   Treatment:    Area cleansed with:  Saline   Amount of cleaning:  Extensive   Irrigation solution:  Sterile saline   Irrigation method:  Syringe   Visualized foreign bodies/material removed: no     Debridement:  None   Undermining:  Extensive Skin repair:    Repair method:  Sutures   Suture size:  3-0   Suture material:  Prolene   Suture technique:  Simple interrupted and horizontal mattress   Number of sutures:  15 Approximation:    Approximation:  Close Post-procedure details:    Dressing:  Adhesive bandage   Patient tolerance of procedure:  Tolerated well, no immediate complications Comments:     3 mattress sutures used to approximate the wound, then closure of the room was finished with with simple interrupted sutures.   Critical Care  ED Course and Medical Decision Making  I have reviewed the triage vital signs and the nursing notes.  Pertinent labs & imaging results that were available during my care of the patient were reviewed by me and considered in my medical decision making (see below for details).  Chainsaw injury to the left leg and this [age] female with history of obesity.  Hemostatic on exam, normal vital signs large wound.  The wound was thoroughly anesthetized and explored, was luckily only involving the subcutaneous tissues, no evidence of bony injury on x-ray, no foreign body.  No evidence of vascular injury  during visual inspection, neurovascularly intact distally.  Repaired as described above.  Patient will receive IV fluids and EKG and likely be discharged.  Tetanus updated here in the ED.  Signed out to oncoming provider at shift change.  Elmer SowMichael M. Pilar PlateBero, MD [facility] [email or web address]  Final Clinical Impressions(s) / ED Diagnoses     ICD-10-CM   1. Contact with chainsaw as cause of accidental injury  W29.3XXA   2. Laceration of left lower extremity, initial encounter  S81.812A     ED Discharge Orders    None         Sabas SousBero, Shahidah Nesbitt M, MD 02/03/19 1610

## 2019-02-04 LAB — TYPE AND SCREEN
Unit division: 0
Unit division: 0

## 2019-02-04 LAB — BPAM RBC
Blood Product Expiration Date: 202007022359
Blood Product Expiration Date: 202007032359
ISSUE DATE / TIME: 202006280940
ISSUE DATE / TIME: 202006280940
Unit Type and Rh: 9500
Unit Type and Rh: 9500

## 2019-02-05 ENCOUNTER — Encounter (HOSPITAL_BASED_OUTPATIENT_CLINIC_OR_DEPARTMENT_OTHER): Payer: Self-pay | Admitting: Emergency Medicine

## 2019-02-13 ENCOUNTER — Other Ambulatory Visit: Payer: Self-pay

## 2019-02-13 ENCOUNTER — Encounter (HOSPITAL_COMMUNITY): Payer: Self-pay

## 2019-02-13 ENCOUNTER — Emergency Department (HOSPITAL_COMMUNITY)
Admission: EM | Admit: 2019-02-13 | Discharge: 2019-02-13 | Disposition: A | Discharge status: Home / Self Care | Payer: Medicaid Other | Attending: Emergency Medicine | Admitting: Emergency Medicine

## 2019-02-13 DIAGNOSIS — Z5189 Encounter for other specified aftercare: Secondary | ICD-10-CM

## 2019-02-13 DIAGNOSIS — Z79899 Other long term (current) drug therapy: Secondary | ICD-10-CM | POA: Diagnosis not present

## 2019-02-13 DIAGNOSIS — I10 Essential (primary) hypertension: Secondary | ICD-10-CM | POA: Diagnosis not present

## 2019-02-13 DIAGNOSIS — S71112D Laceration without foreign body, left thigh, subsequent encounter: Secondary | ICD-10-CM | POA: Insufficient documentation

## 2019-02-13 DIAGNOSIS — W293XXD Contact with powered garden and outdoor hand tools and machinery, subsequent encounter: Secondary | ICD-10-CM | POA: Diagnosis not present

## 2019-02-13 MED ORDER — CLINDAMYCIN HCL 300 MG PO CAPS: 600.0000 mg | ORAL_CAPSULE | Freq: Three times a day (TID) | ORAL | 0 refills | Status: AC

## 2019-02-13 NOTE — ED Triage Notes (Signed)
Sutures on left anterior thigh x 10 days ago. Onset last night dog jumped up on thigh, tearing one stitch out, per patient site as an odor and has been draining slight amount of drainage.  Area around sutures reddened.

## 2019-02-13 NOTE — ED Provider Notes (Signed)
[facility] EMERGENCY DEPARTMENT Provider Note   CSN: [id number] Arrival date & time: 02/13/19  1218    History   Chief Complaint Chief Complaint  Patient presents with  . Wound Check    HPI Emma Cherry is a 23 y.o. female with pmh ADD, anxiety, HTN, obesity, who presents for wound check. Pt had a large laceration to left thigh after a chainsaw accident that was repaired with 15 sutures on 06.27.20. She was also given a course of cephalexin which she completed. Last night pt states her dog jumped on her leg and tore out one suture. Pt states the area surrounding the stiches is red, and that she has noticed small amount of "yellow, green, red" drainage from wound. Pt also states that wound drainage "smells bad." Wound is also tender to touch. Pt denies using any topical antibiotic ointment to wound. Pt concerned that wound may be infected and asking if sutures need to be removed and re-done. She denies any fevers, swelling at wound site, streaking redness, warmth. No medication PTA. Pt is UTD with tetanus as it was given the day she presented for initial injury.  The history is provided by the pt. No language interpreter was used.     HPI  Past Medical History:  Diagnosis Date  . ADD (attention deficit disorder)   . Anxiety   . Hypertension   . Obesity     Patient Active Problem List   Diagnosis Date Noted  . Depressive disorder, not elsewhere classified 02/27/2013  . Attention deficit disorder without mention of hyperactivity 02/22/2013    Past Surgical History:  Procedure Laterality Date  . BARIATRIC SURGERY    . CHOLECYSTECTOMY    . VAGINAL DELIVERY     X 2  . WISDOM TOOTH EXTRACTION  May 2014     OB History   No obstetric history on file.      Home Medications    Prior to Admission medications   Medication Sig Start Date End Date Taking? Authorizing Provider  ALPRAZolam Duanne Moron) 0.5 MG tablet Take 0.5 mg at bedtime as needed by mouth for  anxiety.    [provider]  clindamycin (CLEOCIN) 300 MG capsule Take 2 capsules (600 mg total) by mouth 3 (three) times daily for 7 days. 02/13/19 02/20/19  Archer Asa, NP  cyclobenzaprine (FLEXERIL) 10 MG tablet Take 1 tablet (10 mg total) 2 (two) times daily as needed by mouth for muscle spasms. 06/20/17   Tegeler, Gwenyth Allegra, MD  ibuprofen (ADVIL) 600 MG tablet Take 1 tablet (600 mg total) by mouth every 8 (eight) hours as needed for fever, mild pain or moderate pain. 11/29/18   Ward, Ozella Almond, PA-C  lisdexamfetamine (VYVANSE) 70 MG capsule Take 70 mg daily by mouth.    [provider]  methylphenidate (CONCERTA) 36 MG CR tablet Take 1 tablet (36 mg total) by mouth 2 (two) times daily. 03/01/13   Patrick Jupiter, PA-C  ondansetron (ZOFRAN ODT) 4 MG disintegrating tablet Take 1 tablet (4 mg total) by mouth every 8 (eight) hours as needed for nausea or vomiting. 11/29/18   Ward, Ozella Almond, PA-C  sertraline (ZOLOFT) 25 MG tablet Take 25 mg daily by mouth.    [provider]  traMADol (ULTRAM) 50 MG tablet Take 1 tablet (50 mg total) every 6 (six) hours as needed by mouth. 06/20/17   Tegeler, Gwenyth Allegra, MD    Family History Family History  Problem Relation Age of Onset  .  Bipolar disorder Father   . Bipolar disorder Brother   . Alcohol abuse Maternal Grandfather   . Drug abuse Cousin     Social History Social History   Tobacco Use  . Smoking status: Never Smoker  . Smokeless tobacco: Never Used  Substance Use Topics  . Alcohol use: No  . Drug use: No     Allergies   Codeine and Codeine   Review of Systems Review of Systems  All systems were reviewed and were negative except as stated in the HPI.  Physical Exam Updated Vital Signs BP 127/85   Pulse 95   Temp 98.4 F (36.9 C) (Oral)   Resp 18   LMP  (LMP Unknown)   SpO2 98%   Physical Exam Vitals signs and nursing note reviewed.  Constitutional:      General: She is not in  acute distress.    Appearance: Normal appearance. She is well-developed. She is not ill-appearing or toxic-appearing.  HENT:     Head: Normocephalic and atraumatic.  Cardiovascular:     Rate and Rhythm: Normal rate and regular rhythm.     Pulses: Normal pulses.     Heart sounds: Normal heart sounds.  Pulmonary:     Effort: Pulmonary effort is normal.  Musculoskeletal: Normal range of motion.  Skin:    General: Skin is warm and dry.     Capillary Refill: Capillary refill takes less than 2 seconds.     Findings: Wound present. No rash.       Neurological:     Mental Status: She is alert.     Gait: Gait normal.    ED Treatments / Results  Labs (all labs ordered are listed, but only abnormal results are displayed) Labs Reviewed - No data to display  EKG None  Radiology No results found.  Procedures Procedures (including critical care time)  Medications Ordered in ED Medications - No data to display   Initial Impression / Assessment and Plan / ED Course  I have reviewed the triage vital signs and the nursing notes.  Pertinent labs & imaging results that were available during my care of the patient were reviewed by me and considered in my medical decision making (see chart for details).  23 yo female presents for wound check. On exam, pt is alert, non toxic w/MMM, good distal perfusion, in NAD. VSS, afebrile. Mild erythema surrounding wound and sutures, no streaking. No warmth or induration around wound. No drainage currently. Wound still with close approximation with 14 of the original 15 sutures intact. Non-gaping. Will place pt on a course of clindamycin and instructed pt to continue monitoring wound for any signs of infection. Pt to f/u with PCP, UC, or ED for suture removal as indicated by provider who placed sutures. Strict return precautions discussed. Supportive home measures discussed. Pt d/c'd in good condition. Pt/family/caregiver aware of medical decision making  process and agreeable with plan.           Final Clinical Impressions(s) / ED Diagnoses   Final diagnoses:  Encounter for wound re-check    ED Discharge Orders         Ordered    clindamycin (CLEOCIN) 300 MG capsule  3 times daily     02/13/19 1428           StoryVedia Coffer, Catherine S, NP 02/13/19 1453    Niel HummerKuhner, Ross, MD 02/14/19 [phone]

## 2019-02-23 ENCOUNTER — Encounter (HOSPITAL_COMMUNITY): Payer: Self-pay | Admitting: Emergency Medicine

## 2019-02-23 ENCOUNTER — Emergency Department (HOSPITAL_COMMUNITY): Payer: Medicaid Other

## 2019-02-23 ENCOUNTER — Emergency Department (HOSPITAL_COMMUNITY)
Admission: EM | Admit: 2019-02-23 | Discharge: 2019-02-23 | Disposition: A | Discharge status: Home / Self Care | Payer: Medicaid Other | Attending: Emergency Medicine | Admitting: Emergency Medicine

## 2019-02-23 ENCOUNTER — Other Ambulatory Visit: Payer: Self-pay

## 2019-02-23 DIAGNOSIS — L03116 Cellulitis of left lower limb: Secondary | ICD-10-CM | POA: Diagnosis not present

## 2019-02-23 DIAGNOSIS — Z4802 Encounter for removal of sutures: Secondary | ICD-10-CM

## 2019-02-23 LAB — BASIC METABOLIC PANEL
Anion gap: 12 (ref 5–15)
BUN: 12 mg/dL (ref 6–20)
CO2: 21 mmol/L — ABNORMAL LOW (ref 22–32)
Calcium: 9.7 mg/dL (ref 8.9–10.3)
Chloride: 105 mmol/L (ref 98–111)
Creatinine, Ser: 0.73 mg/dL (ref 0.44–1.00)
GFR calc Af Amer: >60 mL/min (ref >60)
GFR calc non Af Amer: >60 mL/min (ref >60)
Glucose, Bld: 93 mg/dL (ref 70–99)
Potassium: 4.2 mmol/L (ref 3.5–5.1)
Sodium: 138 mmol/L (ref 135–145)

## 2019-02-23 LAB — CBC WITH DIFFERENTIAL/PLATELET
Abs Immature Granulocytes: 0.03 10*3/uL (ref 0.00–0.07)
Basophils Absolute: 0.1 10*3/uL (ref 0.0–0.1)
Basophils Relative: 1 %
Eosinophils Absolute: 0.3 10*3/uL (ref 0.0–0.5)
Eosinophils Relative: 3 %
HCT: 43.4 % (ref 36.0–46.0)
Hemoglobin: 13.7 g/dL (ref 12.0–15.0)
Immature Granulocytes: 0 %
Lymphocytes Relative: 20 %
Lymphs Abs: 2.1 10*3/uL (ref 0.7–4.0)
MCH: 27.1 pg (ref 26.0–34.0)
MCHC: 31.6 g/dL (ref 30.0–36.0)
MCV: 85.8 fL (ref 80.0–100.0)
Monocytes Absolute: 0.6 10*3/uL (ref 0.1–1.0)
Monocytes Relative: 6 %
Neutro Abs: 7.4 10*3/uL (ref 1.7–7.7)
Neutrophils Relative %: 70 %
Platelets: 276 10*3/uL (ref 150–400)
RBC: 5.06 MIL/uL (ref 3.87–5.11)
RDW: 14.1 % (ref 11.5–15.5)
WBC: 10.5 10*3/uL (ref 4.0–10.5)
nRBC: 0 % (ref 0.0–0.2)

## 2019-02-23 MED ORDER — CLINDAMYCIN HCL 300 MG PO CAPS: 600.0000 mg | ORAL_CAPSULE | Freq: Three times a day (TID) | ORAL | 0 refills | Status: AC

## 2019-02-23 MED ORDER — IOHEXOL 300 MG/ML  SOLN
100.0000 mL | Freq: Once | INTRAMUSCULAR | Status: AC | PRN
  Administered 2019-02-23: 100 mL via INTRAVENOUS

## 2019-02-23 MED ORDER — IBUPROFEN 400 MG PO TABS
600.0000 mg | ORAL_TABLET | Freq: Once | ORAL | Status: AC
  Administered 2019-02-23: 600 mg via ORAL
  Filled 2019-02-23: qty 1

## 2019-02-23 NOTE — ED Notes (Addendum)
Skin marker used to outline redness on her leg wound & then covered by a dry drg.

## 2019-02-23 NOTE — ED Triage Notes (Signed)
Pt arrives 21 days post chain saw accident that caused a large laceration on her left thigh. Pt was also seen here on 7/7 for wound infection but reports improvement in swelling but still has some drainage from wound.

## 2019-02-23 NOTE — ED Provider Notes (Signed)
[facility]   CSN: [id number] Arrival date & time: 02/23/19  1249    History   Chief Complaint Chief Complaint  Patient presents with  . Suture / Staple Removal    HPI Emma Cherry is a 23 y.o. female.     HPI  Past Medical History:  Diagnosis Date  . ADD (attention deficit disorder)   . Anxiety   . Hypertension   . Obesity     Patient Active Problem List   Diagnosis Date Noted  . Depressive disorder, not elsewhere classified 02/27/2013  . Attention deficit disorder without mention of hyperactivity 02/22/2013    Past Surgical History:  Procedure Laterality Date  . BARIATRIC SURGERY    . CHOLECYSTECTOMY    . VAGINAL DELIVERY     X 2  . WISDOM TOOTH EXTRACTION  May 2014     OB History   No obstetric history on file.      Home Medications    Prior to Admission medications   Medication Sig Start Date End Date Taking? Authorizing Provider  ALPRAZolam Prudy Feeler(XANAX) 0.5 MG tablet Take 0.5 mg at bedtime as needed by mouth for anxiety.    [provider]  clindamycin (CLEOCIN) 300 MG capsule Take 2 capsules (600 mg total) by mouth 3 (three) times daily for 7 days. X 7 days 02/23/19 03/02/19  Dartha LodgeFord, Alayna Mabe N, PA-C  cyclobenzaprine (FLEXERIL) 10 MG tablet Take 1 tablet (10 mg total) 2 (two) times daily as needed by mouth for muscle spasms. 06/20/17   Tegeler, Canary Brimhristopher J, MD  ibuprofen (ADVIL) 600 MG tablet Take 1 tablet (600 mg total) by mouth every 8 (eight) hours as needed for fever, mild pain or moderate pain. 11/29/18   Ward, Chase PicketJaime Pilcher, PA-C  lisdexamfetamine (VYVANSE) 70 MG capsule Take 70 mg daily by mouth.    [provider]  methylphenidate (CONCERTA) 36 MG CR tablet Take 1 tablet (36 mg total) by mouth 2 (two) times daily. 03/01/13   Yolande JollyWatt, Alan H, PA-C  ondansetron (ZOFRAN ODT) 4 MG disintegrating tablet Take 1 tablet (4 mg total) by mouth every 8 (eight) hours as needed for nausea or  vomiting. 11/29/18   Ward, Chase PicketJaime Pilcher, PA-C  sertraline (ZOLOFT) 25 MG tablet Take 25 mg daily by mouth.    [provider]  traMADol (ULTRAM) 50 MG tablet Take 1 tablet (50 mg total) every 6 (six) hours as needed by mouth. 06/20/17   Tegeler, Canary Brimhristopher J, MD    Family History Family History  Problem Relation Age of Onset  . Bipolar disorder Father   . Bipolar disorder Brother   . Alcohol abuse Maternal Grandfather   . Drug abuse Cousin     Social History Social History   Tobacco Use  . Smoking status: Never Smoker  . Smokeless tobacco: Never Used  Substance Use Topics  . Alcohol use: No  . Drug use: No     Allergies   Codeine and Codeine   Review of Systems Review of Systems  Constitutional: Negative for chills and fever.  Respiratory: Negative for cough and shortness of breath.   Cardiovascular: Negative for chest pain.  Gastrointestinal: Negative for nausea and vomiting.  Musculoskeletal: Negative for arthralgias and myalgias.  Skin: Positive for color change and wound.  Neurological: Negative for weakness and numbness.  All other systems reviewed and are negative.    Physical Exam Updated Vital Signs BP 119/74 (BP Location: Right Arm)   Pulse 93  Temp 97.8 F (36.6 C) (Oral)   Resp 18   LMP  (LMP Unknown)   SpO2 98%   Physical Exam Vitals signs and nursing note reviewed.  Constitutional:      General: She is not in acute distress.    Appearance: Normal appearance. She is well-developed. She is obese. She is not ill-appearing or diaphoretic.  HENT:     Head: Normocephalic and atraumatic.  Eyes:     General:        Right eye: No discharge.        Left eye: No discharge.  Neck:     Musculoskeletal: Neck supple.  Pulmonary:     Effort: Pulmonary effort is normal. No respiratory distress.  Musculoskeletal:        General: No deformity.     Comments: Large sutured laceration to the left thigh with 10 sutures which remain in place,  there is some areas where the wound appears to have opened somewhat and there is a small amount of drainage, there is no area of fluctuance or expressible drainage from the wound but there is some extending redness and a small amount of swelling just medial to the wound on the thigh.  (See photo below). The area is tender to palpation.  There is no palpable crepitus. Distal pulses intact, normal sensation and strength.  Skin:    General: Skin is warm and dry.  Neurological:     Mental Status: She is alert and oriented to person, place, and time.     Coordination: Coordination normal.  Psychiatric:        Mood and Affect: Mood normal.        Behavior: Behavior normal.        ED Treatments / Results  Labs (all labs ordered are listed, but only abnormal results are displayed) Labs Reviewed  BASIC METABOLIC PANEL - Abnormal; Notable for the following components:      Result Value   CO2 21 (*)    All other components within normal limits  CBC WITH DIFFERENTIAL/PLATELET    EKG None  Radiology Ct Femur Left W Contrast  Result Date: 02/23/2019 CLINICAL DATA:  Left thigh chainsaw laceration 3 weeks ago with poor wound healing and continued drainage. EXAM: CT OF THE LOWER LEFT EXTREMITY WITH CONTRAST TECHNIQUE: Multidetector CT imaging of the left thigh was performed according to the standard protocol following intravenous contrast administration. COMPARISON:  None. CONTRAST:  137mL OMNIPAQUE IOHEXOL 300 MG/ML  SOLN FINDINGS: Bones/Joint/Cartilage No acute fracture or dislocation. Joint spaces are preserved. No hip or knee joint effusion. Healed nonossifying fibroma in the posterior distal femoral diaphysis. Ligaments Suboptimally assessed by CT. Muscles and Tendons Grossly intact. Soft tissues Superficial laceration along the anterior distal thigh with mild adjacent skin thickening and underlying fat stranding. No fluid collection or subcutaneous emphysema. No soft tissue mass. IMPRESSION:  1. Superficial laceration along the anterior distal thigh with mild adjacent skin thickening and underlying superficial fat stranding, potentially reflecting cellulitis. No deeper infection or abscess. Electronically Signed   By: Titus Dubin M.D.   On: 02/23/2019 15:57    Procedures .Suture Removal  Date/Time: 02/23/2019 4:38 PM Performed by: Jacqlyn Larsen, PA-C Authorized by: Jacqlyn Larsen, PA-C   Consent:    Consent obtained:  Verbal   Consent given by:  Patient   Risks discussed:  Pain, bleeding and wound separation   Alternatives discussed:  No treatment Location:    Location:  Lower extremity   Lower  extremity location:  Leg   Leg location:  L upper leg Procedure details:    Wound appearance:  Red   Number of sutures removed:  10 (5 sutures had already fallen out prior to evaluation) Post-procedure details:    Post-removal:  Dressing applied (area of redness marked)   Patient tolerance of procedure:  Tolerated well, no immediate complications   (including critical care time)  Medications Ordered in ED Medications  ibuprofen (ADVIL) tablet 600 mg (600 mg Oral Given 02/23/19 1452)  iohexol (OMNIPAQUE) 300 MG/ML solution 100 mL (100 mLs Intravenous Contrast Given 02/23/19 1534)     Initial Impression / Assessment and Plan / ED Course  I have reviewed the triage vital signs and the nursing notes.  Pertinent labs & imaging results that were available during my care of the patient were reviewed by me and considered in my medical decision making (see chart for details).  Patient presents for suture removal, she is 21 days post chainsaw accident where she sustained a large laceration to her left thigh.  She reports 5 sutures have fallen out at home and she has noticed some redness and some areas of intermittent drainage where the wound has opened slightly where sutures have come out.  She was seen in the ED on 7/7 with concern for potential infection after her dog jumped on  the area and caused a suture to come loose.  At this time she was placed on clindamycin.  Patient showed pictures to myself that do show that since being on clindamycin redness does seem to have improved but she continues to have erythema around the sutures.  10 remaining sutures were removed here in the emergency department there was no further dehiscence of the wound.  Patient was seen and evaluated by Dr. Pilar PlateBero as well who placed the initial sutures.  Given erythema and tenderness on palpation and the fact that it has been 21 days and wound is still not entirely healed concern for potential underlying infection, will proceed with labs and CT.  With sutures being in for prolonged time this could also be causing inflammation and irritation in the area contributing to redness.  Labs show no leukocytosis and no acute electrolyte derangements.  CT shows signs of mild cellulitis adjacent to the wound but no signs of deeper infection or abscess.  Given that patient's photographs do show some improvement with clindamycin, she does not have a leukocytosis or systemic symptoms and I think that sutures still being in place could be contributing to persistent inflammation I feel it is reasonable to treat patient with continued course of outpatient antibiotics, I have demarcated the area of redness over the wound, and dressing was placed.  Discussed plan for continued antibiotics at home versus admission and patient is agreement with this plan and would prefer to not be admitted to the hospital at this time.  Strict return precautions discussed.  Patient expresses understanding and agreement with plan.  Discharged home in good condition.  Final Clinical Impressions(s) / ED Diagnoses   Final diagnoses:  Cellulitis of left lower extremity  Visit for suture removal    ED Discharge Orders         Ordered    clindamycin (CLEOCIN) 300 MG capsule  3 times daily     02/23/19 1630           Dartha LodgeFord, Taliesin Hartlage N, [place] JerseyPA-C  02/23/19 1643    Sabas SousBero, Michael M, MD 02/24/19 [phone]

## 2019-02-23 NOTE — Discharge Instructions (Signed)
Please take additional course of antibiotics as directed.  Monitor wound closely if redness is extending beyond the marked lines despite these antibiotics, you have worsening swelling or pain, or develop fevers you should return to the emergency department for reevaluation.  You can follow-up with the wound clinic.

## 2019-03-13 ENCOUNTER — Encounter (HOSPITAL_BASED_OUTPATIENT_CLINIC_OR_DEPARTMENT_OTHER): Payer: Medicaid Other | Attending: Internal Medicine

## 2019-03-13 ENCOUNTER — Other Ambulatory Visit: Payer: Self-pay

## 2019-03-13 DIAGNOSIS — G4730 Sleep apnea, unspecified: Secondary | ICD-10-CM | POA: Diagnosis not present

## 2019-03-13 DIAGNOSIS — I10 Essential (primary) hypertension: Secondary | ICD-10-CM | POA: Diagnosis not present

## 2019-03-13 DIAGNOSIS — Z6841 Body Mass Index (BMI) 40.0 and over, adult: Secondary | ICD-10-CM | POA: Insufficient documentation

## 2019-03-13 DIAGNOSIS — L97121 Non-pressure chronic ulcer of left thigh limited to breakdown of skin: Secondary | ICD-10-CM | POA: Insufficient documentation

## 2019-07-24 IMAGING — CT CT OF THE LEFT FEMUR WITH CONTRAST
3 series · 11 of 33 positions shown, 13 images · IV contrast (agent unspecified)
Comparison: None.

CONTRAST:  100mL OMNIPAQUE IOHEXOL 300 MG/ML  SOLN

CLINICAL DATA: Left thigh chainsaw laceration 3 weeks ago with poor
wound healing and continued drainage.

EXAM:
CT OF THE LOWER LEFT EXTREMITY WITH CONTRAST
TECHNIQUE: Multidetector CT imaging of the left thigh was performed according
to the standard protocol following intravenous contrast
administration.

[Series 5: left femur 2.0 st · axial · 0.57mm/px · z∈[+334,+674]mm · 3 of 277 slices shown, 4 images]
[im 64/277  soft-tissue]
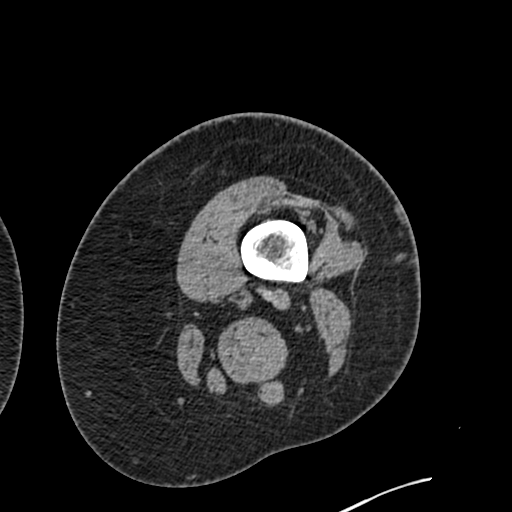
[im 64/277  bone]
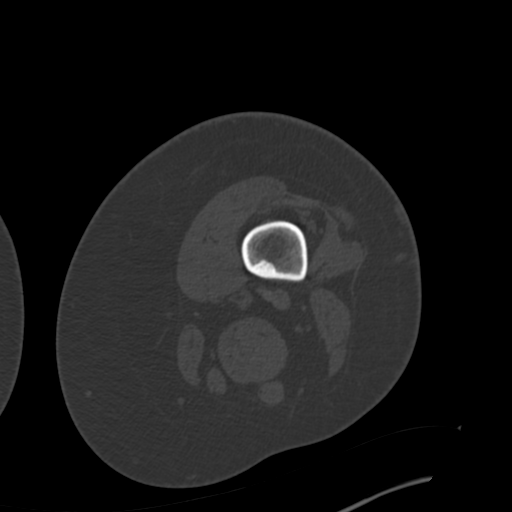
[im 149/277  bone]
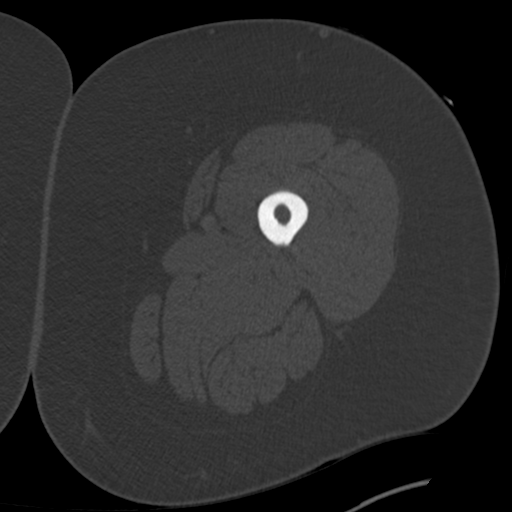
[im 234/277  bone]
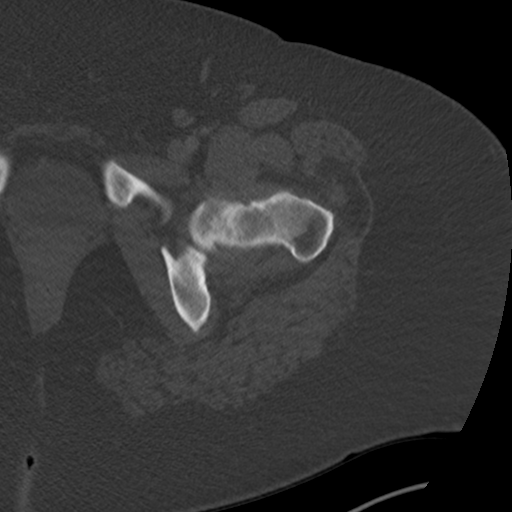

[Series 8: left femur 2.0 cor. st · coronal · 0.59mm/px · 3 of 146 slices shown]
[im 30/146  bone]
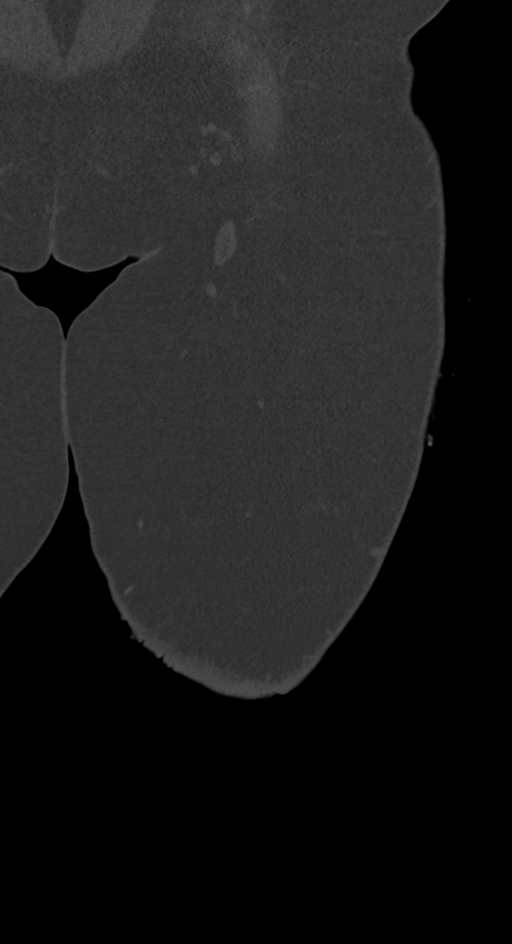
[im 59/146  bone]
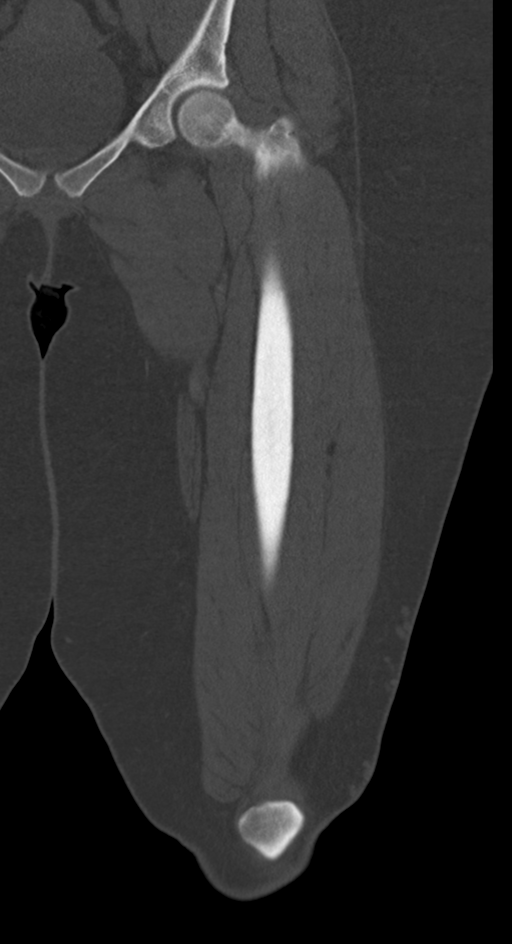
[im 88/146  bone]
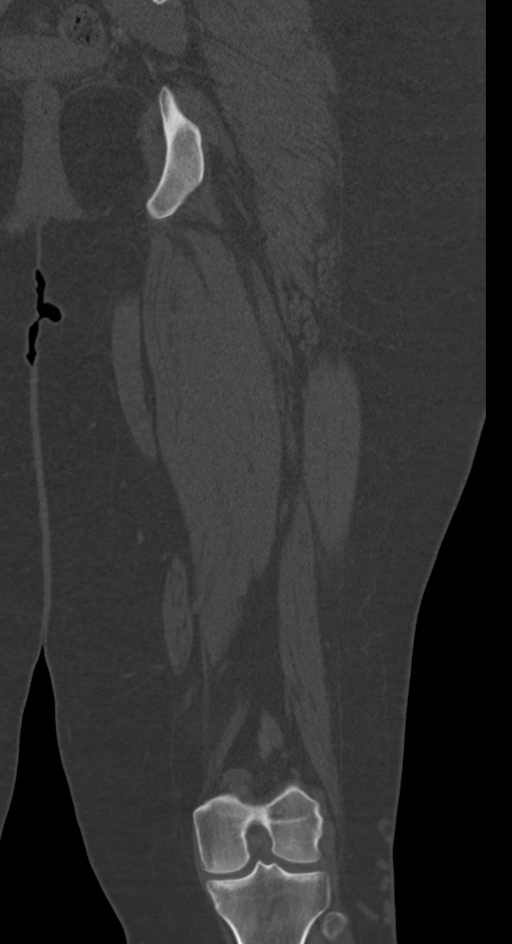

[Series 9: left femur 2.0 sag st · sagittal · 0.62mm/px · 5 of 150 slices shown, 6 images]
[im 50/150  bone]
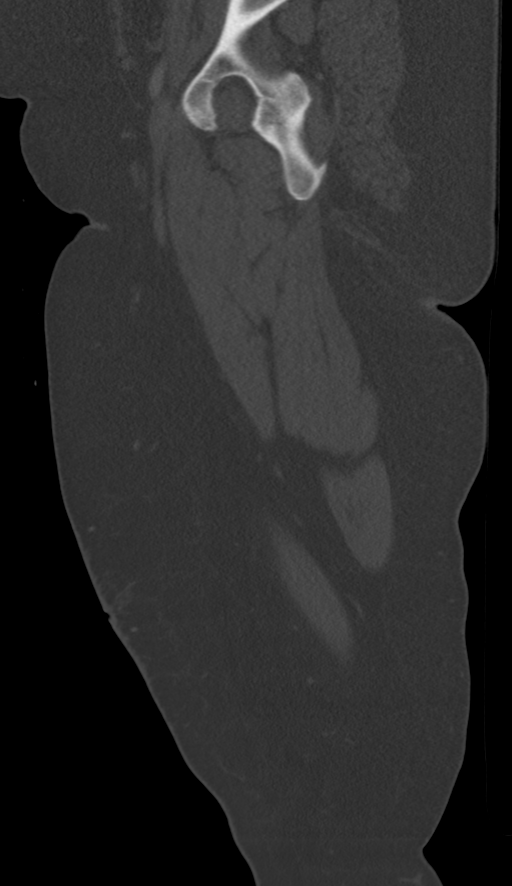
[im 63/150  bone]
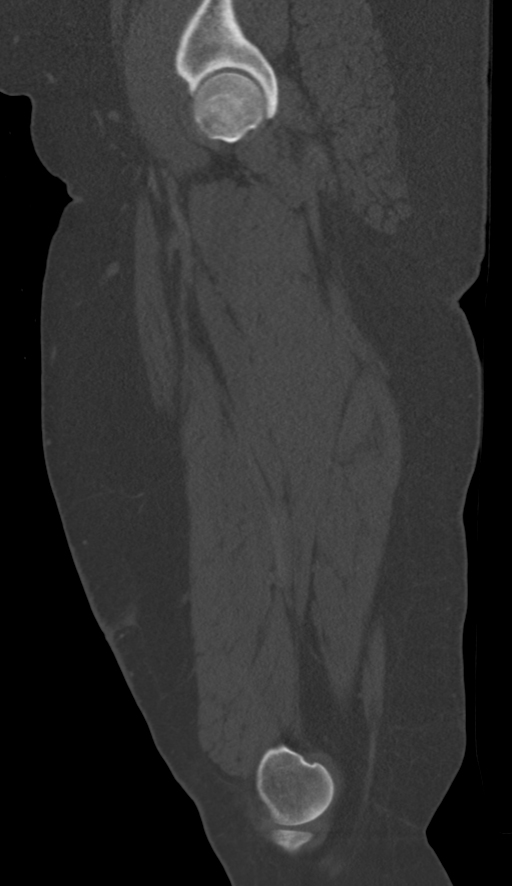
[im 75/150  soft-tissue]
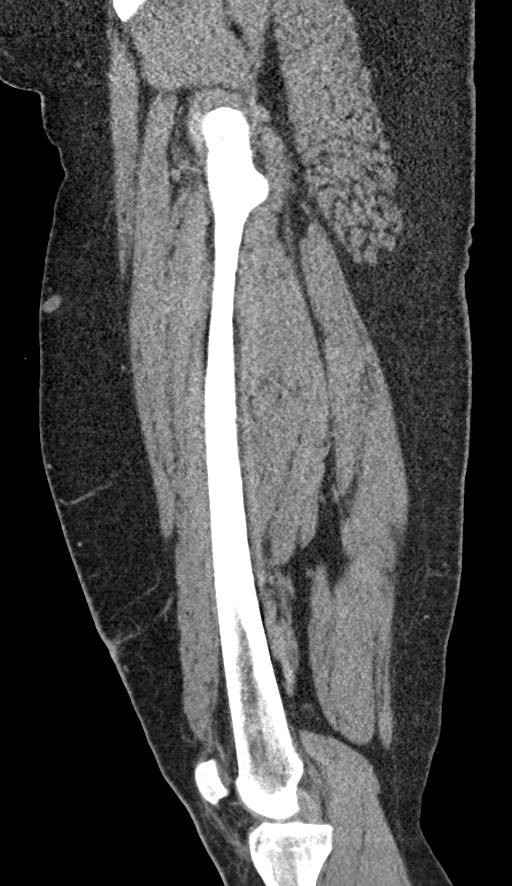
[im 75/150  bone]
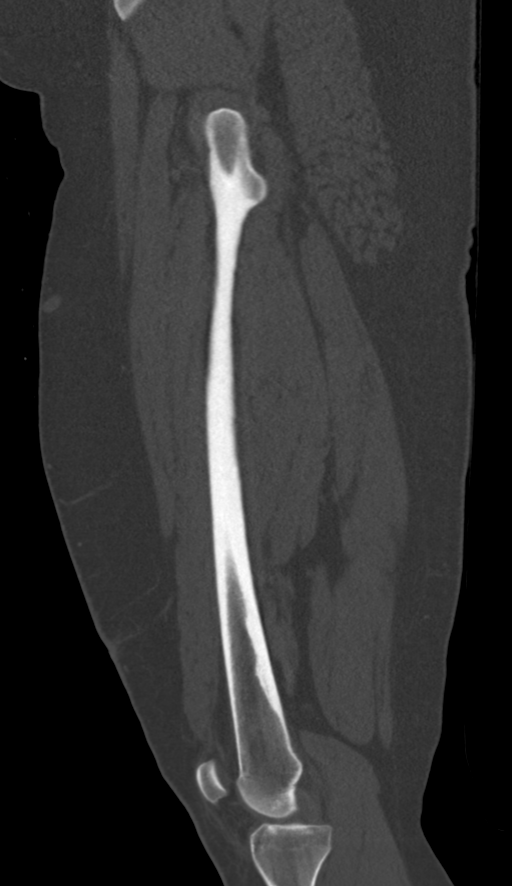
[im 87/150  bone]
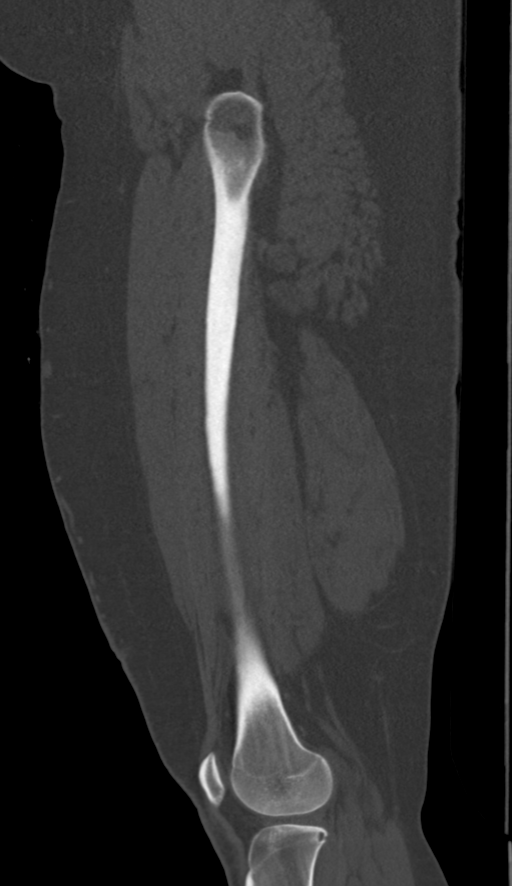
[im 100/150  bone]
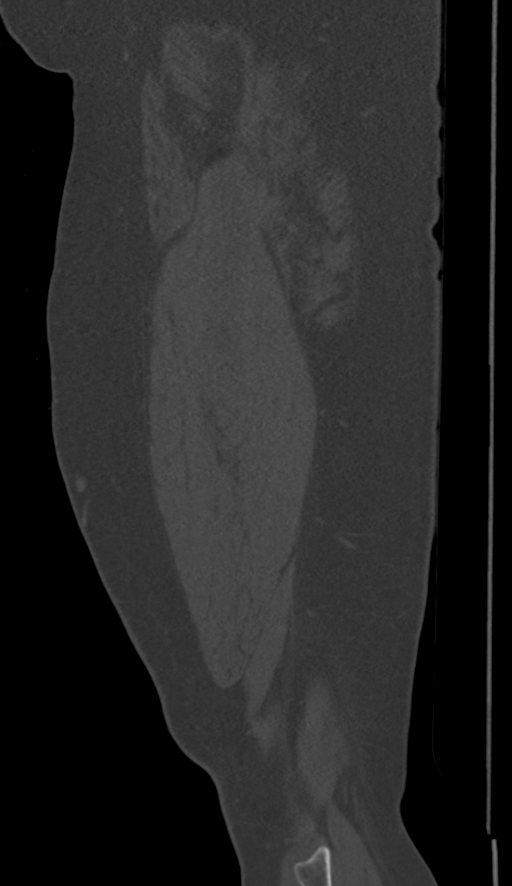

[11 of 33 positions shown; findings below may reference images not displayed]

FINDINGS: Bones/Joint/Cartilage

No acute fracture or dislocation. Joint spaces are preserved. No hip
or knee joint effusion. Healed nonossifying fibroma in the posterior
distal femoral diaphysis.

Ligaments

Suboptimally assessed by CT.

Muscles and Tendons

Grossly intact.

Soft tissues

Superficial laceration along the anterior distal thigh with mild
adjacent skin thickening and underlying fat stranding. No fluid
collection or subcutaneous emphysema. No soft tissue mass.
IMPRESSION: 1. Superficial laceration along the anterior distal thigh with mild
adjacent skin thickening and underlying superficial fat stranding,
potentially reflecting cellulitis. No deeper infection or abscess.

## 2019-08-03 ENCOUNTER — Emergency Department (HOSPITAL_COMMUNITY)
Admission: EM | Admit: 2019-08-03 | Discharge: 2019-08-04 | Disposition: A | Discharge status: Home / Self Care | Payer: Medicaid Other | Attending: Emergency Medicine | Admitting: Emergency Medicine

## 2019-08-03 ENCOUNTER — Emergency Department (HOSPITAL_COMMUNITY): Payer: Medicaid Other

## 2019-08-03 ENCOUNTER — Other Ambulatory Visit: Payer: Self-pay

## 2019-08-03 DIAGNOSIS — I10 Essential (primary) hypertension: Secondary | ICD-10-CM | POA: Diagnosis not present

## 2019-08-03 DIAGNOSIS — U071 COVID-19: Secondary | ICD-10-CM

## 2019-08-03 DIAGNOSIS — J1282 Pneumonia due to coronavirus disease 2019: Secondary | ICD-10-CM

## 2019-08-03 DIAGNOSIS — R0789 Other chest pain: Secondary | ICD-10-CM

## 2019-08-03 DIAGNOSIS — Z79899 Other long term (current) drug therapy: Secondary | ICD-10-CM | POA: Diagnosis not present

## 2019-08-03 DIAGNOSIS — J1289 Other viral pneumonia: Secondary | ICD-10-CM | POA: Diagnosis not present

## 2019-08-03 NOTE — ED Notes (Signed)
Patient states that she has a history of blood clots and has been taking a low dose aspirin daily for the past two days.

## 2019-08-03 NOTE — ED Triage Notes (Signed)
Patient coming from home with complaints of chest pain, shortness of breath, and fatigue since Sunday. Patient endorses central chest pain that is constant and not improved with rest. Patient is able to talk in full sentences at this time. Patient was tested at [facility] on Thursday and has a positive COVID result.

## 2019-08-04 ENCOUNTER — Emergency Department (HOSPITAL_COMMUNITY): Payer: Medicaid Other

## 2019-08-04 ENCOUNTER — Encounter (HOSPITAL_COMMUNITY): Payer: Self-pay

## 2019-08-04 LAB — COMPREHENSIVE METABOLIC PANEL
ALT: 43 U/L (ref 0–44)
AST: 31 U/L (ref 15–41)
Albumin: 3.6 g/dL (ref 3.5–5.0)
Alkaline Phosphatase: 109 U/L (ref 38–126)
Anion gap: 12 (ref 5–15)
BUN: 12 mg/dL (ref 6–20)
CO2: 24 mmol/L (ref 22–32)
Calcium: 8.7 mg/dL — ABNORMAL LOW (ref 8.9–10.3)
Chloride: 99 mmol/L (ref 98–111)
Creatinine, Ser: 0.74 mg/dL (ref 0.44–1.00)
GFR calc Af Amer: >60 mL/min (ref >60)
GFR calc non Af Amer: >60 mL/min (ref >60)
Glucose, Bld: 90 mg/dL (ref 70–99)
Potassium: 3.3 mmol/L — ABNORMAL LOW (ref 3.5–5.1)
Sodium: 135 mmol/L (ref 135–145)
Total Bilirubin: 0.3 mg/dL (ref 0.3–1.2)
Total Protein: 7.9 g/dL (ref 6.5–8.1)

## 2019-08-04 LAB — CBC WITH DIFFERENTIAL/PLATELET
Abs Immature Granulocytes: 0.02 10*3/uL (ref 0.00–0.07)
Basophils Absolute: 0 10*3/uL (ref 0.0–0.1)
Basophils Relative: 0 %
Eosinophils Absolute: 0 10*3/uL (ref 0.0–0.5)
Eosinophils Relative: 1 %
HCT: 44 % (ref 36.0–46.0)
Hemoglobin: 14.1 g/dL (ref 12.0–15.0)
Immature Granulocytes: 1 %
Lymphocytes Relative: 46 %
Lymphs Abs: 1.7 10*3/uL (ref 0.7–4.0)
MCH: 27.3 pg (ref 26.0–34.0)
MCHC: 32 g/dL (ref 30.0–36.0)
MCV: 85.1 fL (ref 80.0–100.0)
Monocytes Absolute: 0.2 10*3/uL (ref 0.1–1.0)
Monocytes Relative: 6 %
Neutro Abs: 1.7 10*3/uL (ref 1.7–7.7)
Neutrophils Relative %: 46 %
Platelets: 128 10*3/uL — ABNORMAL LOW (ref 150–400)
RBC: 5.17 MIL/uL — ABNORMAL HIGH (ref 3.87–5.11)
RDW: 14.4 % (ref 11.5–15.5)
WBC: 3.7 10*3/uL — ABNORMAL LOW (ref 4.0–10.5)
nRBC: 0 % (ref 0.0–0.2)

## 2019-08-04 LAB — I-STAT BETA HCG BLOOD, ED (MC, WL, AP ONLY): I-stat hCG, quantitative: <5 m[IU]/mL (ref <5)

## 2019-08-04 LAB — TROPONIN I (HIGH SENSITIVITY): Troponin I (High Sensitivity): <2 ng/L (ref <18)

## 2019-08-04 MED ORDER — AEROCHAMBER PLUS FLO-VU MEDIUM MISC
1.0000 | Freq: Once | Status: AC
  Administered 2019-08-04: 1
  Filled 2019-08-04: qty 1

## 2019-08-04 MED ORDER — SODIUM CHLORIDE (PF) 0.9 % IJ SOLN
INTRAMUSCULAR | Status: AC
  Filled 2019-08-04: qty 50

## 2019-08-04 MED ORDER — ALBUTEROL SULFATE HFA 108 (90 BASE) MCG/ACT IN AERS
2.0000 | INHALATION_SPRAY | RESPIRATORY_TRACT | Status: DC | PRN
  Administered 2019-08-04: 2 via RESPIRATORY_TRACT
  Filled 2019-08-04: qty 6.7

## 2019-08-04 MED ORDER — IOHEXOL 350 MG/ML SOLN
100.0000 mL | Freq: Once | INTRAVENOUS | Status: AC | PRN
  Administered 2019-08-04: 100 mL via INTRAVENOUS

## 2019-08-04 MED ORDER — BENZONATATE 100 MG PO CAPS: 100.0000 mg | ORAL_CAPSULE | Freq: Three times a day (TID) | ORAL | 0 refills | Status: AC | PRN

## 2019-08-04 NOTE — ED Provider Notes (Signed)
[facility] DEPT Provider Note   CSN: [id number] Arrival date & time: 08/03/19  2217     History Chief Complaint  Patient presents with   COVID +   Shortness of Breath   Chest Pain    Emma Cherry is a 23 y.o. female with a hx of HTN, ADD presents to the Emergency Department complaining of gradual, persistent, progressively worsening chest pain, body aches onset 5 days ago.  Pt reports chest pain is constant, central, severe pain that is worse with deep breathing.  Pt reports associated headache, shortness of breath, decreased taste/smell, decreased appetite.  Pt denies fever, chills, abd pain, N/V/D. Pt reports being evaluated at [facility] yesterday with a positive COVID test.  Pt reports taking tylenol without relief.  Pt reports taking ASA 81mg  yesterday because she previously had a DVT in her left leg.  Pt was taking OCP at the time.  Pt stopped taking OCP last week. Nothing seems to make her symptoms better.      The history is provided by the patient and medical records. No language interpreter was used.       Past Medical History:  Diagnosis Date   ADD (attention deficit disorder)    Anxiety    Hypertension    Obesity     Patient Active Problem List   Diagnosis Date Noted   Depressive disorder, not elsewhere classified 02/27/2013   Attention deficit disorder without mention of hyperactivity 02/22/2013    Past Surgical History:  Procedure Laterality Date   BARIATRIC SURGERY     CHOLECYSTECTOMY     VAGINAL DELIVERY     X 2   WISDOM TOOTH EXTRACTION  May 2014     OB History   No obstetric history on file.     Family History  Problem Relation Age of Onset   Bipolar disorder Father    Bipolar disorder Brother    Alcohol abuse Maternal Grandfather    Drug abuse Cousin     Social History   Tobacco Use   Smoking status: Never Smoker   Smokeless tobacco: Never Used  Substance Use Topics   Alcohol use: No     Drug use: No    Home Medications Prior to Admission medications   Medication Sig Start Date End Date Taking? Authorizing Provider  ALPRAZolam Duanne Moron) 0.5 MG tablet Take 0.5 mg at bedtime as needed by mouth for anxiety.    [provider]  benzonatate (TESSALON PERLES) 100 MG capsule Take 1 capsule (100 mg total) by mouth 3 (three) times daily as needed for cough (cough). 08/04/19   Romell Cavanah, Jarrett Soho, PA-C  cyclobenzaprine (FLEXERIL) 10 MG tablet Take 1 tablet (10 mg total) 2 (two) times daily as needed by mouth for muscle spasms. 06/20/17   Tegeler, Gwenyth Allegra, MD  ibuprofen (ADVIL) 600 MG tablet Take 1 tablet (600 mg total) by mouth every 8 (eight) hours as needed for fever, mild pain or moderate pain. 11/29/18   Ward, Ozella Almond, PA-C  lisdexamfetamine (VYVANSE) 70 MG capsule Take 70 mg daily by mouth.    [provider]  methylphenidate (CONCERTA) 36 MG CR tablet Take 1 tablet (36 mg total) by mouth 2 (two) times daily. 03/01/13   Patrick Jupiter, PA-C  ondansetron (ZOFRAN ODT) 4 MG disintegrating tablet Take 1 tablet (4 mg total) by mouth every 8 (eight) hours as needed for nausea or vomiting. 11/29/18   Ward, Ozella Almond, PA-C  sertraline (ZOLOFT) 25 MG tablet Take 25  mg daily by mouth.    [provider]  traMADol (ULTRAM) 50 MG tablet Take 1 tablet (50 mg total) every 6 (six) hours as needed by mouth. 06/20/17   Tegeler, Canary Brim, MD    Allergies    Codeine and Codeine  Review of Systems   Review of Systems  Constitutional: Positive for appetite change and fatigue. Negative for chills and fever.  HENT: Positive for congestion. Negative for ear discharge, ear pain, mouth sores, postnasal drip, rhinorrhea, sinus pressure and sore throat.   Eyes: Negative for visual disturbance.  Respiratory: Positive for cough, chest tightness, shortness of breath and wheezing. Negative for stridor.   Cardiovascular: Positive for chest pain. Negative for  palpitations.  Gastrointestinal: Negative for abdominal pain, diarrhea, nausea and vomiting.  Genitourinary: Negative for dysuria, frequency, hematuria and urgency.  Musculoskeletal: Negative for arthralgias, back pain, myalgias and neck stiffness.  Skin: Negative for rash.  Neurological: Positive for headaches. Negative for syncope, light-headedness and numbness.  Hematological: Negative for adenopathy.  Psychiatric/Behavioral: The patient is not nervous/anxious.   All other systems reviewed and are negative.   Physical Exam Updated Vital Signs BP 106/60 (BP Location: Left Arm)    Pulse 74    Temp 98.1 F (36.7 C) (Oral)    Resp 20    Ht 5\' 8"  (1.727 m)    Wt (!) 145.2 kg    SpO2 95%    BMI 48.66 kg/m   Physical Exam Vitals and nursing note reviewed.  Constitutional:      General: She is not in acute distress.    Appearance: She is not diaphoretic.  HENT:     Head: Normocephalic.  Eyes:     General: No scleral icterus.    Conjunctiva/sclera: Conjunctivae normal.  Cardiovascular:     Rate and Rhythm: Normal rate and regular rhythm.     Pulses: Normal pulses.          Radial pulses are 2+ on the right side and 2+ on the left side.  Pulmonary:     Effort: No tachypnea, accessory muscle usage, prolonged expiration, respiratory distress or retractions.     Breath sounds: No stridor.     Comments: Equal chest rise. No increased work of breathing. Abdominal:     General: There is no distension.     Palpations: Abdomen is soft.     Tenderness: There is no abdominal tenderness. There is no guarding or rebound.  Musculoskeletal:     Cervical back: Normal range of motion.     Right lower leg: No tenderness. No edema.     Left lower leg: No tenderness. No edema.     Comments: Moves all extremities equally and without difficulty.  Skin:    General: Skin is warm and dry.     Capillary Refill: Capillary refill takes less than 2 seconds.  Neurological:     Mental Status: She is  alert.     GCS: GCS eye subscore is 4. GCS verbal subscore is 5. GCS motor subscore is 6.     Comments: Speech is clear and goal oriented.  Psychiatric:        Mood and Affect: Mood normal.     ED Results / Procedures / Treatments   Labs (all labs ordered are listed, but only abnormal results are displayed) Labs Reviewed  CBC WITH DIFFERENTIAL/PLATELET - Abnormal; Notable for the following components:      Result Value   WBC 3.7 (*)    RBC  5.17 (*)    Platelets 128 (*)    All other components within normal limits  COMPREHENSIVE METABOLIC PANEL - Abnormal; Notable for the following components:   Potassium 3.3 (*)    Calcium 8.7 (*)    All other components within normal limits  I-STAT BETA HCG BLOOD, ED ([facility], [facility], [facility] ONLY)  TROPONIN I (HIGH SENSITIVITY)    EKG EKG Interpretation  Date/Time:  Friday August 03 2019 23:02:10 EST Ventricular Rate:  80 PR Interval:    QRS Duration: 99 QT Interval:  386 QTC Calculation: 446 R Axis:   52 Text Interpretation: Sinus rhythm Otherwise within normal limits Baseline wander in lead(s) II aVR V1 V2 V5 When compared with ECG of 06/20/2017, HEART RATE has decreased Confirmed by Dione BoozeGlick, David ([id number]) on 08/04/2019 2:04:27 AM   Radiology CT Angio Chest PE W and/or Wo Contrast  Result Date: 08/04/2019 CLINICAL DATA:  Chest pain, shortness of breath and fatigue since Sunday EXAM: CT ANGIOGRAPHY CHEST WITH CONTRAST TECHNIQUE: Multidetector CT imaging of the chest was performed using the standard protocol during bolus administration of intravenous contrast. Multiplanar CT image reconstructions and MIPs were obtained to evaluate the vascular anatomy. CONTRAST:  100mL OMNIPAQUE IOHEXOL 350 MG/ML SOLN COMPARISON:  Chest CTA 08/04/2018 FINDINGS: Cardiovascular: Satisfactory opacification of the pulmonary arteries however evaluation beyond the lobar levels is limited due to extensive respiratory motion artifact. No large central or lobar filling defects  are identified. Central pulmonary arteries are normal caliber. Cardiac silhouette is borderline enlarged no pericardial effusion. The aorta is normal caliber. Mediastinum/Nodes: Scattered low-attenuation subcentimeter mediastinal and hilar nodes are likely reactive. No pathologically enlarged mediastinal, hilar or axillary adenopathy. There is a hypoattenuating 1.7 cm nodule arising from the inferior left thyroid gland. No acute abnormality of the trachea or esophagus. Lungs/Pleura: There are multifocal areas of dense consolidation and surrounding ground-glass opacity throughout the both upper and lower lobes and lingula. Relative sparing of the right middle lobe is noted. Mild airways thickening is present as well. No pneumothorax or effusion. Evaluation of the parenchyma is somewhat limited due to respiratory motion artifact. Upper Abdomen: No acute abnormalities present in the visualized portions of the upper abdomen. Musculoskeletal: Multilevel degenerative changes are present in the imaged portions of the spine. Review of the MIP images confirms the above findings. IMPRESSION: 1. Study is limited due to extensive respiratory motion artifact. No large central or lobar pulmonary emboli are identified. 2. Multifocal areas of dense consolidation and surrounding ground-glass opacity throughout the both upper and lower lobes and lingula. Relative sparing of the right middle lobe. Findings are most consistent with multifocal pneumonia. 3. Numerous low-attenuation subcentimeter lymph nodes throughout the mediastinum and hila are likely reactive. 4. 1.7 cm hypoattenuating nodule arising from the inferior left thyroid gland. Further evaluation with nonemergent thyroid ultrasound is recommended. This follows consensus guidelines: Managing Incidental Thyroid Nodules Detected on Imaging: White Paper of the ACR Incidental Thyroid Findings Committee. J Am Coll Radiol 2015; 12:143-150. and [facility] 3-tiered system for managing  ITNs: J Am Coll Radiol. 2015; Feb;12(2): 143-50 5. Borderline cardiomegaly. Electronically Signed   By: Kreg ShropshirePrice  DeHay M.D.   On: 08/04/2019 02:58   DG Chest Port 1 View  Result Date: 08/04/2019 CLINICAL DATA:  Chest pain, COVID-19 positive EXAM: PORTABLE CHEST 1 VIEW COMPARISON:  Radiograph November 29, 2018, CT 08/04/2018 FINDINGS: Consolidative opacity is seen in the left suprahilar region and in the mid to lower lungs bilaterally. Lung volumes are diminished with some atelectatic changes in  the bases. Cardiomediastinal contours are similar to comparison. No pneumothorax. No visible effusion. Telemetry leads overlie the chest. No acute osseous or soft tissue abnormality. IMPRESSION: 1. Multifocal consolidation, compatible with pneumonia in the setting of COVID-19 positivity. 2. Low lung volumes with atelectatic changes in the bases. Electronically Signed   By: Kreg Shropshire M.D.   On: 08/04/2019 00:39    Procedures Procedures (including critical care time)  Medications Ordered in ED Medications  albuterol (VENTOLIN HFA) 108 (90 Base) MCG/ACT inhaler 2 puff (has no administration in time range)  AeroChamber Plus Flo-Vu Medium MISC 1 each (has no administration in time range)  sodium chloride (PF) 0.9 % injection (  Given by Other 08/04/19 0237)  iohexol (OMNIPAQUE) 350 MG/ML injection 100 mL (100 mLs Intravenous Contrast Given 08/04/19 0237)    ED Course  I have reviewed the triage vital signs and the nursing notes.  Pertinent labs & imaging results that were available during my care of the patient were reviewed by me and considered in my medical decision making (see chart for details).  Clinical Course as of Aug 03 304  Fri Aug 03, 2019  2339 No tachycardia  Pulse Rate: 84 [HM]  2339 Afebrile  Temp: 98.1 F (36.7 C) [HM]  Sat Aug 04, 2019  0302 Pulse oximetry with ambulation was 95% on room air.    [HM]    Clinical Course User Index [HM] Kyngston Pickelsimer, Boyd Kerbs   MDM  Rules/Calculators/A&P                       Emma Cherry was evaluated in Emergency Department on 08/04/2019 for the symptoms described in the history of present illness. She was evaluated in the context of the global COVID-19 pandemic, which necessitated consideration that the patient might be at risk for infection with the SARS-CoV-2 virus that causes COVID-19. Institutional protocols and algorithms that pertain to the evaluation of patients at risk for COVID-19 are in a state of rapid change based on information released by regulatory bodies including the CDC and federal and state organizations. These policies and algorithms were followed during the patient's care in the ED.  Patient presents with known Covid, chest pain and shortness of breath.  Clinically she does not appear to have pulmonary embolism as she is not tachycardic however she has history of DVT and now Covid.  Labs are reassuring.  Mild hypokalemia is noted.  EKG without ischemia.  No evidence of myocarditis at this time.  Doubt ACS as the cause of her chest pain.  CT angio with multifocal pneumonia, likely Covid in nature.  Patient is afebrile.  Less likely to be secondary bacterial infection.  No pulmonary embolism.  Patient will be discharged home with symptomatic therapy.  Of note, CT scan did see thyroid nodule for which she will need outpatient follow-up.  She does not endorse thyroid symptoms.  Discussed reasons to return to the emergency department.    Final Clinical Impression(s) / ED Diagnoses Final diagnoses:  Pneumonia due to COVID-19 virus  Atypical chest pain    Rx / DC Orders ED Discharge Orders         Ordered    benzonatate (TESSALON PERLES) 100 MG capsule  3 times daily PRN     08/04/19 0304           Valyn Latchford, Boyd Kerbs 08/04/19 4098    Dione Booze, MD 08/04/19 0730

## 2019-08-04 NOTE — ED Notes (Addendum)
Patient had a vagal response with IV insertion. Patient endorsed nausea and "not feeling well." HR dropped to the 40s/50s. Vitals documented. Patient placed in trendelenburg. EDP made aware.

## 2019-08-04 NOTE — ED Notes (Signed)
Patient transported to CT 

## 2019-08-04 NOTE — ED Notes (Signed)
Pulse oximetry with ambulation was 95% on room air.

## 2019-08-04 NOTE — Discharge Instructions (Addendum)
1. Medications: Albuterol for shortness of breath, tessalon for coughing, Alternate tylenol and ibuprofen for fever control, continue usual home medications 2. Treatment: rest, drink plenty of fluids, isolate for the next 10 days 3. Follow Up: Please followup with your primary doctor if your symptoms are not improving after 10-14 days; Please return to the ER for high fevers, persistent vomiting, worsening shortness of breath or other concerns.

## 2019-08-04 NOTE — ED Notes (Signed)
Patient states that she is feeling better. HR has returned to baseline. Patient remains in trendelenburg at this time.

## 2022-08-23 ENCOUNTER — Emergency Department (HOSPITAL_BASED_OUTPATIENT_CLINIC_OR_DEPARTMENT_OTHER): Payer: Medicaid Other

## 2022-08-23 ENCOUNTER — Encounter (HOSPITAL_BASED_OUTPATIENT_CLINIC_OR_DEPARTMENT_OTHER): Payer: Self-pay | Admitting: Emergency Medicine

## 2022-08-23 ENCOUNTER — Emergency Department (HOSPITAL_BASED_OUTPATIENT_CLINIC_OR_DEPARTMENT_OTHER)
Admission: EM | Admit: 2022-08-23 | Discharge: 2022-08-23 | Disposition: A | Discharge status: Home / Self Care | Payer: Medicaid Other | Attending: Emergency Medicine | Admitting: Emergency Medicine

## 2022-08-23 ENCOUNTER — Other Ambulatory Visit: Payer: Self-pay

## 2022-08-23 DIAGNOSIS — S6991XA Unspecified injury of right wrist, hand and finger(s), initial encounter: Secondary | ICD-10-CM | POA: Diagnosis present

## 2022-08-23 DIAGNOSIS — S61302A Unspecified open wound of right middle finger with damage to nail, initial encounter: Secondary | ICD-10-CM | POA: Diagnosis not present

## 2022-08-23 DIAGNOSIS — Y92838 Other recreation area as the place of occurrence of the external cause: Secondary | ICD-10-CM | POA: Insufficient documentation

## 2022-08-23 DIAGNOSIS — W232XXA Caught, crushed, jammed or pinched between a moving and stationary object, initial encounter: Secondary | ICD-10-CM | POA: Insufficient documentation

## 2022-08-23 DIAGNOSIS — S6710XA Crushing injury of unspecified finger(s), initial encounter: Secondary | ICD-10-CM

## 2022-08-23 MED ORDER — TRAMADOL HCL 50 MG PO TABS: 50.0000 mg | ORAL_TABLET | Freq: Four times a day (QID) | ORAL | 0 refills | Status: AC | PRN

## 2022-08-23 MED ORDER — LIDOCAINE HCL (PF) 1 % IJ SOLN
INTRAMUSCULAR | Status: AC
  Administered 2022-08-23: 5 mL
  Filled 2022-08-23: qty 5

## 2022-08-23 MED ORDER — LIDOCAINE HCL (PF) 1 % IJ SOLN
5.0000 mL | Freq: Once | INTRAMUSCULAR | Status: AC
  Administered 2022-08-23: 5 mL via INTRADERMAL
  Filled 2022-08-23: qty 5

## 2022-08-23 MED ORDER — SODIUM CHLORIDE 0.9 % IV BOLUS
1000.0000 mL | Freq: Once | INTRAVENOUS | Status: AC
  Administered 2022-08-23: 1000 mL via INTRAVENOUS

## 2022-08-23 MED ORDER — ACETAMINOPHEN 325 MG PO TABS
650.0000 mg | ORAL_TABLET | Freq: Once | ORAL | Status: AC
  Administered 2022-08-23: 650 mg via ORAL
  Filled 2022-08-23: qty 2

## 2022-08-23 NOTE — ED Notes (Signed)
Patient verbalizes understanding of discharge instructions. Opportunity for questioning and answers were provided. Armband removed by staff, pt discharged from ED. Ambulated out to lobby  

## 2022-08-23 NOTE — ED Notes (Signed)
PA-C at bedside for procedure.  

## 2022-08-23 NOTE — Discharge Instructions (Addendum)
Call Dr. Teofilo Pod office tomorrow to obtain a follow-up for them to evaluate your hand.  You may keep the nailbed foil in place until you follow-up unless it falls out and that is okay.  WOUND CARE You have dissolving sutures that should result dissolve on their own.  Keep area clean and dry for 24 hours. Do not remove bandage, if applied.  After 24 hours, remove bandage and wash wound gently with mild soap and warm water. Reapply a new bandage after cleaning wound, if directed.  Continue daily cleansing with soap and water until stitches/staples are removed.  Do not apply any ointments or creams to the wound while stitches/staples are in place, as this may cause delayed healing.  Seek medical careif you experience any of the following signs of infection: Swelling, redness, pus drainage, streaking, fever >101.0 F  Seek care if you experience excessive bleeding that does not stop after 15-20 minutes of constant, firm pressure.

## 2022-08-23 NOTE — ED Notes (Signed)
Pt had syncopal episode while sitting in the chair and going over d/c paperwork with this RN. Pt was moved to trendelenburg in the chair, and all monitoring wires applied. Pt aroused to sternal rub shortly afterward. Provider notified and IVF's ordered.

## 2022-08-23 NOTE — ED Notes (Signed)
Pt "feels much better", IVF's are done, and mom is on the way to drive her home

## 2022-08-23 NOTE — ED Triage Notes (Signed)
Right hand injury yesterday m middle finger obviously bruised and deformed , no ROM middle right , 4 th and 5th finger . Bleeding controlled.

## 2022-08-23 NOTE — ED Provider Notes (Signed)
[facility] EMERGENCY DEPARTMENT Provider Note   CSN: [id number] Arrival date & time: 08/23/22  1707     History  Chief Complaint  Patient presents with   Hand Injury    right    Emma Cherry is a 27 y.o. female Crush injury to R hand 4 days ago at a go cart track. She has had brusing and wrosening pain and swelling.  She has a ring stuck on the middle finger which is had progressively worsening swelling to she has decreased sensation to light touch over the fourth and fifth digits.  She has decreased range of motion of her third fourth and fifth digits.  She is right-hand dominant complains of severe pressure under the nail   Hand Injury      Home Medications Prior to Admission medications   Medication Sig Start Date End Date Taking? Authorizing Provider  traMADol (ULTRAM) 50 MG tablet Take 1 tablet (50 mg total) by mouth every 6 (six) hours as needed. 08/23/22  Yes Shanyn Preisler, PA-C  ALPRAZolam Prudy Feeler) 0.5 MG tablet Take 0.5 mg at bedtime as needed by mouth for anxiety.    [provider]  benzonatate (TESSALON PERLES) 100 MG capsule Take 1 capsule (100 mg total) by mouth 3 (three) times daily as needed for cough (cough). 08/04/19   Muthersbaugh, Dahlia Client, PA-C  cyclobenzaprine (FLEXERIL) 10 MG tablet Take 1 tablet (10 mg total) 2 (two) times daily as needed by mouth for muscle spasms. 06/20/17   Tegeler, Canary Brim, MD  ibuprofen (ADVIL) 600 MG tablet Take 1 tablet (600 mg total) by mouth every 8 (eight) hours as needed for fever, mild pain or moderate pain. 11/29/18   Ward, Chase Picket, PA-C  lisdexamfetamine (VYVANSE) 70 MG capsule Take 70 mg daily by mouth.    [provider]  methylphenidate (CONCERTA) 36 MG CR tablet Take 1 tablet (36 mg total) by mouth 2 (two) times daily. 03/01/13   Yolande Jolly, PA-C  ondansetron (ZOFRAN ODT) 4 MG disintegrating tablet Take 1 tablet (4 mg total) by mouth every 8 (eight) hours as needed for nausea or  vomiting. 11/29/18   Ward, Chase Picket, PA-C  sertraline (ZOLOFT) 25 MG tablet Take 25 mg daily by mouth.    [provider]  traMADol (ULTRAM) 50 MG tablet Take 1 tablet (50 mg total) every 6 (six) hours as needed by mouth. 06/20/17   Tegeler, Canary Brim, MD      Allergies    Codeine and Codeine    Review of Systems   Review of Systems  Physical Exam Updated Vital Signs BP 110/64   Pulse 64   Temp 98.7 F (37.1 C) (Oral)   Resp 18   Ht 5\' 8"  (1.727 m)   Wt 82.6 kg   LMP 08/16/2022 (Approximate)   SpO2 100%   BMI 27.67 kg/m  Physical Exam Vitals and nursing note reviewed.  Constitutional:      General: She is not in acute distress.    Appearance: She is well-developed. She is not diaphoretic.  HENT:     Head: Normocephalic and atraumatic.     Right Ear: External ear normal.     Left Ear: External ear normal.     Nose: Nose normal.     Mouth/Throat:     Mouth: Mucous membranes are moist.  Eyes:     General: No scleral icterus.    Conjunctiva/sclera: Conjunctivae normal.  Cardiovascular:     Rate and Rhythm: Normal rate  and regular rhythm.     Heart sounds: Normal heart sounds. No murmur heard.    No friction rub. No gallop.  Pulmonary:     Effort: Pulmonary effort is normal. No respiratory distress.     Breath sounds: Normal breath sounds.  Abdominal:     General: Bowel sounds are normal. There is no distension.     Palpations: Abdomen is soft. There is no mass.     Tenderness: There is no abdominal tenderness. There is no guarding.  Musculoskeletal:     Cervical back: Normal range of motion.  Skin:    General: Skin is warm and dry.  Neurological:     Mental Status: She is alert and oriented to person, place, and time.  Psychiatric:        Behavior: Behavior normal.     ED Results / Procedures / Treatments   Labs (all labs ordered are listed, but only abnormal results are displayed) Labs Reviewed - No data to  display  EKG None  Radiology DG Hand Complete Right  Result Date: 08/23/2022 CLINICAL DATA:  Pain after injury. EXAM: RIGHT HAND - COMPLETE 3 VIEW COMPARISON:  None Available. FINDINGS: No acute fracture or dislocation. Preserved joint spaces and bone mineralization. Ring artifact obscures the proximal phalanx of third digit. There is lucency in the soft tissues dorsal to the distal phalanx of the third digit. Please correlate for a area of soft tissue injury or gas. IMPRESSION: Focal thickening and potential soft tissue gas along the tissues adjacent to distal phalanx of the third digit. Please correlate with exact location of injury. No underlying fracture or dislocation. Electronically Signed   By: Jill Side M.D.   On: 08/23/2022 17:35    Procedures .Nail Removal  Date/Time: 08/24/2022 10:28 AM  Performed by: Margarita Mail, PA-C Authorized by: Margarita Mail, PA-C   Consent:    Consent obtained:  Verbal   Consent given by:  Patient   Risks discussed:  Bleeding, incomplete removal, infection and permanent nail deformity Universal protocol:    Patient identity confirmed:  Arm band Location:    Hand:  R long finger Pre-procedure details:    Skin preparation:  Povidone-iodine   Preparation: Patient was prepped and draped in the usual sterile fashion   Anesthesia:    Anesthesia method:  Nerve block   Block needle gauge:  25 G   Block anesthetic:  Lidocaine 1% w/o epi   Block technique:  Mcp   Block injection procedure:  Anatomic landmarks identified, introduced needle, incremental injection, negative aspiration for blood and anatomic landmarks palpated   Block outcome:  Anesthesia achieved Nail Removal:    Nail removed:  Complete   Nail bed repaired: no     Removed nail replaced and anchored: yes     Stented with:  Sterile foil Post-procedure details:    Dressing:  Gauze roll   Procedure completion:  Tolerated well, no immediate complications     Medications Ordered  in ED Medications  lidocaine (PF) (XYLOCAINE) 1 % injection 5 mL (5 mLs Intradermal Given 08/23/22 1915)  lidocaine (PF) (XYLOCAINE) 1 % injection 5 mL (5 mLs Intradermal Given 08/23/22 1915)  lidocaine (PF) (XYLOCAINE) 1 % injection (5 mLs  Given 08/23/22 1916)  acetaminophen (TYLENOL) tablet 650 mg (650 mg Oral Given 08/23/22 2019)  sodium chloride 0.9 % bolus 1,000 mL (0 mLs Intravenous Stopped 08/23/22 2143)    ED Course/ Medical Decision Making/ A&P  Medical Decision Making This is a 27 year old female who injured her finger several days ago.  She presents due to worsening pain and swelling Patient has obvious subungual hematoma and the nail plate is nearly completely detached prior to my interventions.  I was able to complete a nerve block during which time patient began to feel syncopal.  She was placed in Trendelenburg at which point she began to feel improved.  She sat up and began to feel syncopal again.  She laid down for the remainder of the procedure. Patient's finger was wrapped and dressed.  She was able to sit up and was feeling better however just prior to treatment discharge had almost complete syncope.  She was laid back in Trendelenburg.  Placed on cardiac monitor.  I obtained an EKG.  EKG showed normal sinus rhythm.  She did not become hypotensive.  She was given a liter of fluid and allowed to rest for approximately 1 hour.  After this time patient became significantly improved and was ambulatory without any evidence of chest pain, shortness of breath or feeling like she was going to lose consciousness again.  Patient then given discharge instructions and hand follow-up.  She appears otherwise appropriate for discharge.  Suspect vasovagal event  Amount and/or Complexity of Data Reviewed Radiology: ordered and independent interpretation performed.    Details: I visualized and interpreted a right hand x-ray which shows thickening of the right middle  finger.  There is some sub-Q gas this is likely due to near complete nail removal. ECG/medicine tests: ordered.  Risk OTC drugs. Prescription drug management.           Final Clinical Impression(s) / ED Diagnoses Final diagnoses:  Crushing injury of finger, initial encounter  Injury of nail bed of finger of right hand, initial encounter    Rx / DC Orders ED Discharge Orders          Ordered    traMADol (ULTRAM) 50 MG tablet  Every 6 hours PRN        08/23/22 2002              Margarita Mail, PA-C 08/24/22 1035    Cristie Hem, MD 08/25/22 [phone]
